# Patient Record
Sex: Male | Born: 1955
Health system: Southern US, Community
[De-identification: ages and names within clinical notes are randomized; demographics above are authoritative.]

## PROBLEM LIST (undated history)

## (undated) DIAGNOSIS — Z87891 Personal history of nicotine dependence: Secondary | ICD-10-CM

## (undated) DIAGNOSIS — F17290 Nicotine dependence, other tobacco product, uncomplicated: Secondary | ICD-10-CM

## (undated) DIAGNOSIS — E785 Hyperlipidemia, unspecified: Secondary | ICD-10-CM

## (undated) DIAGNOSIS — E669 Obesity, unspecified: Secondary | ICD-10-CM

## (undated) HISTORY — DX: Obesity, unspecified: E66.9

## (undated) HISTORY — PX: APPENDECTOMY: SHX54

## (undated) HISTORY — DX: Hyperlipidemia, unspecified: E78.5

## (undated) HISTORY — DX: Personal history of nicotine dependence: Z87.891

## (undated) HISTORY — DX: Nicotine dependence, other tobacco product, uncomplicated: F17.290

---

## 1978-07-22 HISTORY — PX: KNEE ARTHROSCOPY: SHX127

## 1987-07-23 HISTORY — PX: ANTERIOR CRUCIATE LIGAMENT REPAIR: SHX115

## 1998-07-22 HISTORY — PX: ANGIOPLASTY: SHX39

## 2016-07-05 ENCOUNTER — Emergency Department (HOSPITAL_COMMUNITY)
Admission: EM | Admit: 2016-07-05 | Discharge: 2016-07-05 | Disposition: A | Discharge status: Home / Self Care | Payer: BLUE CROSS/BLUE SHIELD | Attending: Emergency Medicine | Admitting: Emergency Medicine

## 2016-07-05 DIAGNOSIS — Y999 Unspecified external cause status: Secondary | ICD-10-CM | POA: Insufficient documentation

## 2016-07-05 DIAGNOSIS — S0501XA Injury of conjunctiva and corneal abrasion without foreign body, right eye, initial encounter: Secondary | ICD-10-CM | POA: Diagnosis not present

## 2016-07-05 DIAGNOSIS — Y929 Unspecified place or not applicable: Secondary | ICD-10-CM | POA: Insufficient documentation

## 2016-07-05 DIAGNOSIS — Y939 Activity, unspecified: Secondary | ICD-10-CM | POA: Diagnosis not present

## 2016-07-05 DIAGNOSIS — X58XXXA Exposure to other specified factors, initial encounter: Secondary | ICD-10-CM | POA: Insufficient documentation

## 2016-07-05 DIAGNOSIS — S0591XA Unspecified injury of right eye and orbit, initial encounter: Secondary | ICD-10-CM | POA: Diagnosis present

## 2016-07-05 MED ORDER — FLUORESCEIN SODIUM 0.6 MG OP STRP
1.0000 | ORAL_STRIP | Freq: Once | OPHTHALMIC | Status: DC
  Filled 2016-07-05: qty 1

## 2016-07-05 MED ORDER — ERYTHROMYCIN 5 MG/GM OP OINT: TOPICAL_OINTMENT | OPHTHALMIC | 0 refills | Status: DC

## 2016-07-05 MED ORDER — TETRACAINE HCL 0.5 % OP SOLN
2.0000 [drp] | Freq: Once | OPHTHALMIC | Status: DC
  Filled 2016-07-05: qty 4

## 2016-07-05 NOTE — ED Provider Notes (Signed)
[facility]-EMERGENCY DEPT Provider Note   CSN: [id number] Arrival date & time: 07/05/16  1540   By signing my name below, I, Soijett Blue, attest that this documentation has been prepared under the direction and in the presence of Fayrene HelperBowie Armanda Forand, PA-C Electronically Signed: Soijett Blue, ED Scribe. 07/05/16. 4:18 PM.  History   Chief Complaint Chief Complaint  Patient presents with  . Eye Injury    HPI Bobby Hale is a 60 y.o. male who presents to the Emergency Department complaining of right eye pain onset 10 AM this morning. Pt states that he was renovating a house and believes that he got debris in his eye while working earlier this morning. Pt denies wearing contact lenses. Pt states that his last tetanus vaccination was within the past 5 years. He states that he is having associated symptoms of blurred vision to right eye, FB sensation to right eye, and watery right eye. He states that he has tried eye irrigation without medications for the relief of his symptoms. He denies fever, chills, and any other symptoms.  The history is provided by the patient. No language interpreter was used.    No past medical history on file.  There are no active problems to display for this patient.   No past surgical history on file.     Home Medications    Prior to Admission medications   Not on File    Family History No family history on file.  Social History Social History  Substance Use Topics  . Smoking status: Not on file  . Smokeless tobacco: Not on file  . Alcohol use Not on file     Allergies   Penicillins   Review of Systems Review of Systems  Constitutional: Negative for chills and fever.  Eyes: Positive for pain (right) and visual disturbance (blurred vision to right eye).       +Watery right eye.  +FB sensation to right eye.    Physical Exam Updated Vital Signs BP 136/79 (BP Location: Right Arm)   Pulse 62   Temp 98.2 F (36.8 C) (Oral)   Resp 18   Ht 6'  5" (1.956 m)   Wt 196 lb 11.2 oz (89.2 kg)   SpO2 96%   BMI 23.33 kg/m   Physical Exam  Constitutional: He is oriented to person, place, and time. He appears well-developed and well-nourished. No distress.  HENT:  Head: Normocephalic and atraumatic.  Eyes: EOM are normal. Pupils are equal, round, and reactive to light. Lids are everted and swept, no foreign bodies found. Right conjunctiva is injected.  Slit lamp exam:      The right eye shows corneal abrasion and fluorescein uptake (2 mm). The right eye shows no corneal ulcer and no foreign body.  Right conjunctiva injected, but limbic sparring. PERRL. EOM intact. Small abrasion to lateral aspect of right cornea. No dendritic lesion. No ulceration. Negative seidel sign.   Neck: Neck supple.  Cardiovascular: Normal rate.   Pulmonary/Chest: Effort normal. No respiratory distress.  Abdominal: He exhibits no distension.  Musculoskeletal: Normal range of motion.  Neurological: He is alert and oriented to person, place, and time.  Skin: Skin is warm and dry.  Psychiatric: He has a normal mood and affect. His behavior is normal.  Nursing note and vitals reviewed.    ED Treatments / Results  DIAGNOSTIC STUDIES: Oxygen Saturation is 96% on RA, nl by my interpretation.    COORDINATION OF CARE: 4:15 PM Discussed treatment plan with pt  at bedside which includes visual acuity, erythromycin ointment, referral and follow up with opthalmology, and pt agreed to plan.   Procedures Procedures (including critical care time)  Medications Ordered in ED Medications  tetracaine (PONTOCAINE) 0.5 % ophthalmic solution 2 drop (not administered)  fluorescein ophthalmic strip 1 strip (not administered)     Initial Impression / Assessment and Plan / ED Course  I have reviewed the triage vital signs and the nursing notes.   Clinical Course    BP 136/79 (BP Location: Right Arm)   Pulse 62   Temp 98.2 F (36.8 C) (Oral)   Resp 18   Ht 6\' 5"   (1.956 m)   Wt 89.2 kg   SpO2 96%   BMI 23.33 kg/m   Pt with corneal abrasion on exam. No evidence of FB.  Exam not concerning for orbital cellulitis, hyphema. No concern for uveitis. Patient will be discharged home with erythromycin ointment Rx.  Patient understands to follow up with ophthalmology; return precautions discussed. Patient appears safe for discharged.   Final Clinical Impressions(s) / ED Diagnoses   Final diagnoses:  Abrasion of right cornea, initial encounter    New Prescriptions New Prescriptions   ERYTHROMYCIN OPHTHALMIC OINTMENT    Place a 1/2 inch ribbon of ointment into the lower eyelid every 6 hours while awake for 5 days.    I personally performed the services described in this documentation, which was scribed in my presence. The recorded information has been reviewed and is accurate.       Fayrene HelperBowie Anjali Manzella, PA-C 07/05/16 1629    Gwyneth SproutWhitney Plunkett, MD 07/05/16 2300

## 2016-07-05 NOTE — ED Triage Notes (Signed)
Pt states he was working on renovating a house and got some debris in his R eye this morning. States he had tearing and pain from eye and now it still feels like something is in his eye. He thinks his last tetanus shot was within 5 yrs. Rates pain 8/10.

## 2016-07-17 ENCOUNTER — Encounter (HOSPITAL_COMMUNITY): Payer: Self-pay | Admitting: Emergency Medicine

## 2016-07-17 ENCOUNTER — Emergency Department (HOSPITAL_COMMUNITY)
Admission: EM | Admit: 2016-07-17 | Discharge: 2016-07-17 | Disposition: A | Discharge status: Home / Self Care | Payer: BLUE CROSS/BLUE SHIELD | Attending: Emergency Medicine | Admitting: Emergency Medicine

## 2016-07-17 ENCOUNTER — Emergency Department (HOSPITAL_COMMUNITY): Payer: BLUE CROSS/BLUE SHIELD

## 2016-07-17 DIAGNOSIS — Y939 Activity, unspecified: Secondary | ICD-10-CM | POA: Insufficient documentation

## 2016-07-17 DIAGNOSIS — S61313A Laceration without foreign body of left middle finger with damage to nail, initial encounter: Secondary | ICD-10-CM | POA: Insufficient documentation

## 2016-07-17 DIAGNOSIS — F17200 Nicotine dependence, unspecified, uncomplicated: Secondary | ICD-10-CM | POA: Insufficient documentation

## 2016-07-17 DIAGNOSIS — Y999 Unspecified external cause status: Secondary | ICD-10-CM | POA: Diagnosis not present

## 2016-07-17 DIAGNOSIS — W312XXA Contact with powered woodworking and forming machines, initial encounter: Secondary | ICD-10-CM | POA: Insufficient documentation

## 2016-07-17 DIAGNOSIS — Y9289 Other specified places as the place of occurrence of the external cause: Secondary | ICD-10-CM | POA: Insufficient documentation

## 2016-07-17 DIAGNOSIS — Z23 Encounter for immunization: Secondary | ICD-10-CM | POA: Diagnosis not present

## 2016-07-17 DIAGNOSIS — Z79899 Other long term (current) drug therapy: Secondary | ICD-10-CM | POA: Diagnosis not present

## 2016-07-17 DIAGNOSIS — S61209A Unspecified open wound of unspecified finger without damage to nail, initial encounter: Secondary | ICD-10-CM

## 2016-07-17 DIAGNOSIS — S61213A Laceration without foreign body of left middle finger without damage to nail, initial encounter: Secondary | ICD-10-CM | POA: Diagnosis not present

## 2016-07-17 MED ORDER — CEPHALEXIN 500 MG PO CAPS: 500.0000 mg | ORAL_CAPSULE | Freq: Four times a day (QID) | ORAL | 0 refills | Status: DC

## 2016-07-17 MED ORDER — BUPIVACAINE HCL (PF) 0.5 % IJ SOLN
10.0000 mL | Freq: Once | INTRAMUSCULAR | Status: AC
  Administered 2016-07-17: 6 mL
  Filled 2016-07-17: qty 10

## 2016-07-17 MED ORDER — OXYCODONE-ACETAMINOPHEN 5-325 MG PO TABS: 1.0000 | ORAL_TABLET | ORAL | 0 refills | Status: DC | PRN

## 2016-07-17 MED ORDER — TETANUS-DIPHTH-ACELL PERTUSSIS 5-2.5-18.5 LF-MCG/0.5 IM SUSP
0.5000 mL | Freq: Once | INTRAMUSCULAR | Status: AC
  Administered 2016-07-17: 0.5 mL via INTRAMUSCULAR
  Filled 2016-07-17: qty 0.5

## 2016-07-17 MED ORDER — ONDANSETRON 8 MG PO TBDP
8.0000 mg | ORAL_TABLET | Freq: Once | ORAL | Status: AC
  Administered 2016-07-17: 8 mg via ORAL
  Filled 2016-07-17: qty 1

## 2016-07-17 NOTE — Discharge Instructions (Signed)
Keep the laceration clean and dry. Apply triple antibiotic ointment twice a day. Follow with Dr. Orlan Leavensrtman as referred to week.

## 2016-07-17 NOTE — ED Triage Notes (Signed)
Patient reports "knicking" left middle finger with circular table saw today. Laceration over finger nail. Bleeding controlled.

## 2016-07-17 NOTE — ED Provider Notes (Signed)
[facility]-EMER[facility] DEPT Provider Note   CSN: [id number] Arrival date & time: 07/17/16  1342    By signing my name below, I, Cynda AcresHailei Fulton, attest that this documentation has been prepared under the direction and in the presence of Jaynie Crumbleatyana Adair Lauderback, PA-C Electronically Signed: Cynda AcresHailei Fulton, Scribe. 07/17/16. 2:00 PM.  History   Chief Complaint Chief Complaint  Patient presents with  . Laceration    HPI Comments: Bobby Hale is a 60 y.o. male who presents to the Emergency Department complaining of sudden-onset, constant middle finger pain s/p laceration that happened earlier today. Patient states he was cutting wood with a table saw and he "knicked" his finger. He has associated light headedness and diaphoresis. Patient states he feels like he's going to faint.  He is unaware when his last tetanus shot was. No modifying factor indicated. He denies profuse bleeding during injury. Pressure was applied with paper towel prior to arrival.   The history is provided by the patient. No language interpreter was used.    History reviewed. No pertinent past medical history.  There are no active problems to display for this patient.   History reviewed. No pertinent surgical history.     Home Medications    Prior to Admission medications   Medication Sig Start Date End Date Taking? Authorizing Provider  erythromycin ophthalmic ointment Place a 1/2 inch ribbon of ointment into the lower eyelid every 6 hours while awake for 5 days. 07/05/16   Fayrene HelperBowie Tran, PA-C    Family History History reviewed. No pertinent family history.  Social History Social History  Substance Use Topics  . Smoking status: Light Tobacco Smoker  . Smokeless tobacco: Never Used  . Alcohol use Not on file     Allergies   Penicillins   Review of Systems Review of Systems  Constitutional: Positive for diaphoresis.  Musculoskeletal: Positive for arthralgias.  Skin: Positive for wound.  Neurological:  Positive for light-headedness.     Physical Exam Updated Vital Signs BP 90/58 (BP Location: Left Arm)   Pulse 79   Temp 98.8 F (37.1 C) (Oral)   Resp 20   SpO2 98%   Physical Exam  Constitutional: He appears well-developed and well-nourished. No distress.  Eyes: Conjunctivae are normal.  Neck: Neck supple.  Cardiovascular: Normal rate, regular rhythm and normal heart sounds.   Pulmonary/Chest: Effort normal and breath sounds normal. No respiratory distress. He has no wheezes. He has no rales.  Abdominal: He exhibits no distension.  Musculoskeletal:  Laceration to the dorsal distal left middle finger partially involving the fingernail and underlying nail bed. No exposed bone. Refill less than 2 seconds to the fingertip. Sensation is intact.  Skin: Skin is warm and dry.  Nursing note and vitals reviewed.      ED Treatments / Results  DIAGNOSTIC STUDIES: Oxygen Saturation is 98% on RA, normal by my interpretation.    COORDINATION OF CARE: 2:09 PM Discussed treatment plan with pt at bedside and pt agreed to plan.  Labs (all labs ordered are listed, but only abnormal results are displayed) Labs Reviewed - No data to display  EKG  EKG Interpretation None       Radiology No results found.  Procedures Procedures (including critical care time)  NERVE BLOCK Performed by: Jaynie CrumbleKIRICHENKO, [place] A Consent: Verbal consent obtained. Required items: required blood products, implants, devices, and special equipment available Time out: Immediately prior to procedure a "time out" was called to verify the correct patient, procedure, equipment, support staff and site/side  marked as required.  Indication: pain control Nerve block body site: right middle finger  Preparation: Patient was prepped and draped in the usual sterile fashion. Needle gauge: 27G Location technique: anatomical landmarks  Local anesthetic: bupivacaine 0.5% WO epi    Anesthetic total: 5 ml  Outcome:  pain improved Patient tolerance: Patient tolerated the procedure well with no immediate complications.  Medications Ordered in ED Medications - No data to display   Initial Impression / Assessment and Plan / ED Course  I have reviewed the triage vital signs and the nursing notes.  Pertinent labs & imaging results that were available during my care of the patient were reviewed by me and considered in my medical decision making (see chart for details).  Clinical Course    Patient with laceration to the distal left middle finger, with some tissue missing. Digital block performed for pain control. Patient is diaphoretic and lightheaded, most likely vagal from pain and bleeding. Patient's wife states that he does get sometimes like this when he sees bodily fluids. Patient was laid down flat. He feels better. Laceration was extensively cleaned with surgical scrub sponge, saline, nonstick dressing and sterile gauze was applied. I discussed patient with Dr. Orlan Leavensrtman, who agreed with treatment plan and will follow-up in the office next week. Will start on Keflex to prevent infection. Pain medication will be prescribed. Instructed to keep finger clean, dressing changes twice a day, follow-up next week or as needed.   Vitals:   07/17/16 1348 07/17/16 1545  BP: 90/58 132/76  Pulse: 79 62  Resp: 20 18  Temp: 98.8 F (37.1 C)   TempSrc: Oral   SpO2: 98% 97%     Final Clinical Impressions(s) / ED Diagnoses   Final diagnoses:  Fingertip avulsion, initial encounter    New Prescriptions Discharge Medication List as of 07/17/2016  3:38 PM    START taking these medications   Details  cephALEXin (KEFLEX) 500 MG capsule Take 1 capsule (500 mg total) by mouth 4 (four) times daily., Starting Wed 07/17/2016, Print    oxyCODONE-acetaminophen (PERCOCET) 5-325 MG tablet Take 1 tablet by mouth every 4 (four) hours as needed for severe pain., Starting Wed 07/17/2016, Print       I personally performed  the services described in this documentation, which was scribed in my presence. The recorded information has been reviewed and is accurate.    Jaynie Crumbleatyana Jaydah Stahle, PA-C 07/17/16 1630    Cathren LaineKevin Steinl, MD 07/17/16 2041

## 2016-08-24 ENCOUNTER — Emergency Department (HOSPITAL_COMMUNITY): Payer: BLUE CROSS/BLUE SHIELD

## 2016-08-24 ENCOUNTER — Encounter (HOSPITAL_COMMUNITY): Payer: Self-pay | Admitting: Emergency Medicine

## 2016-08-24 ENCOUNTER — Emergency Department (HOSPITAL_COMMUNITY)
Admission: EM | Admit: 2016-08-24 | Discharge: 2016-08-24 | Disposition: A | Discharge status: Home / Self Care | Payer: BLUE CROSS/BLUE SHIELD | Attending: Emergency Medicine | Admitting: Emergency Medicine

## 2016-08-24 DIAGNOSIS — Y929 Unspecified place or not applicable: Secondary | ICD-10-CM | POA: Diagnosis not present

## 2016-08-24 DIAGNOSIS — F17200 Nicotine dependence, unspecified, uncomplicated: Secondary | ICD-10-CM | POA: Diagnosis not present

## 2016-08-24 DIAGNOSIS — Y939 Activity, unspecified: Secondary | ICD-10-CM | POA: Insufficient documentation

## 2016-08-24 DIAGNOSIS — S61301A Unspecified open wound of left index finger with damage to nail, initial encounter: Secondary | ICD-10-CM | POA: Diagnosis not present

## 2016-08-24 DIAGNOSIS — W231XXA Caught, crushed, jammed, or pinched between stationary objects, initial encounter: Secondary | ICD-10-CM | POA: Diagnosis not present

## 2016-08-24 DIAGNOSIS — Z79899 Other long term (current) drug therapy: Secondary | ICD-10-CM | POA: Insufficient documentation

## 2016-08-24 DIAGNOSIS — Y999 Unspecified external cause status: Secondary | ICD-10-CM | POA: Insufficient documentation

## 2016-08-24 DIAGNOSIS — M79645 Pain in left finger(s): Secondary | ICD-10-CM | POA: Diagnosis not present

## 2016-08-24 DIAGNOSIS — S61209A Unspecified open wound of unspecified finger without damage to nail, initial encounter: Secondary | ICD-10-CM

## 2016-08-24 DIAGNOSIS — S6992XA Unspecified injury of left wrist, hand and finger(s), initial encounter: Secondary | ICD-10-CM | POA: Diagnosis not present

## 2016-08-24 MED ORDER — LIDOCAINE HCL 1 % IJ SOLN
INTRAMUSCULAR | Status: AC
  Filled 2016-08-24: qty 20

## 2016-08-24 MED ORDER — SULFAMETHOXAZOLE-TRIMETHOPRIM 800-160 MG PO TABS: 1.0000 | ORAL_TABLET | Freq: Two times a day (BID) | ORAL | 0 refills | Status: AC

## 2016-08-24 MED ORDER — LIDOCAINE HCL (PF) 1 % IJ SOLN: 5.0000 mL | Freq: Once | INTRAMUSCULAR | Status: DC

## 2016-08-24 MED ORDER — SULFAMETHOXAZOLE-TRIMETHOPRIM 800-160 MG PO TABS
1.0000 | ORAL_TABLET | Freq: Once | ORAL | Status: AC
  Administered 2016-08-24: 1 via ORAL
  Filled 2016-08-24: qty 1

## 2016-08-24 NOTE — ED Notes (Signed)
Pt soaking finger in water and betadine.

## 2016-08-24 NOTE — Discharge Instructions (Signed)
As discussed, it is important that you monitor your condition carefully, and if you develop new, or concerning changes in your condition return here for further evaluation. Otherwise, please be sure to follow-up with our hand surgeon.  In the coming days, please keep the wound clean, dry, wrapped.

## 2016-08-24 NOTE — ED Triage Notes (Signed)
Pt presents with left index finger injury related to dresser landing on it. Fat tissue noted. Pt able to move finger appropriately.

## 2016-08-24 NOTE — ED Provider Notes (Signed)
[facility]ER[facility] DEPT Provider Note   CSN: [id number] Arrival date & time: 08/24/16  1529  By signing my name below, I, Doreatha Martin, attest that this documentation has been prepared under the direction and in the presence of Gerhard Munch, MD. Electronically Signed: Doreatha Martin, ED Scribe. 08/24/16. 3:53 PM.     History   Chief Complaint Chief Complaint  Patient presents with  . Finger Injury    HPI Ivis Henneman is a 61 y.o. male who presents to the Emergency Department complaining of a laceration with controlled bleeding to the left index finger that occurred just PTA. 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 He denies falls, LOC, head injury. Pt reports associated pain to the area surrounding the wound. He states he took 2 Percocet PTA with some relief of pain. He denies numbness, additional injuries.   The history is provided by the patient. No language interpreter was used.    History reviewed. No pertinent past medical history.  There are no active problems to display for this patient.   History reviewed. No pertinent surgical history.     Home Medications    Prior to Admission medications   Medication Sig Start Date End Date Taking? Authorizing Provider  cephALEXin (KEFLEX) 500 MG capsule Take 1 capsule (500 mg total) by mouth 4 (four) times daily. 07/17/16   Tatyana Kirichenko, PA-C  erythromycin ophthalmic ointment Place a 1/2 inch ribbon of ointment into the lower eyelid every 6 hours while awake for 5 days. 07/05/16   Fayrene Helper, PA-C  oxyCODONE-acetaminophen (PERCOCET) 5-325 MG tablet Take 1 tablet by mouth every 4 (four) hours as needed for severe pain. 07/17/16   Jaynie Crumble, PA-C    Family History No family history on file.  Social History Social History  Substance Use Topics  . Smoking status: Light Tobacco Smoker  . Smokeless tobacco: Never Used  . Alcohol use Not on file     Allergies     Penicillins   Review of Systems Review of Systems  Constitutional: Negative for fever.  Respiratory: Negative for shortness of breath.   Cardiovascular: Negative for chest pain.  Musculoskeletal:       Negative aside from HPI  Skin:       Negative aside from HPI  Allergic/Immunologic: Negative for immunocompromised state.  Neurological: Negative for weakness.     Physical Exam Updated Vital Signs BP 116/58 (BP Location: Left Arm)   Pulse 68   Temp 97.8 F (36.6 C) (Oral)   Resp 18   Ht 6\' 5"  (1.956 m)   Wt 250 lb (113.4 kg)   SpO2 99%   BMI 29.65 kg/m   Physical Exam  Constitutional: He is oriented to person, place, and time. He appears well-developed. No distress.  HENT:  Head: Normocephalic and atraumatic.  Eyes: Conjunctivae and EOM are normal.  Cardiovascular: Normal rate, regular rhythm and intact distal pulses.   Pulmonary/Chest: Effort normal. No stridor. No respiratory distress.  Abdominal: He exhibits no distension.  Musculoskeletal: He exhibits no edema.       Arms: Neurological: He is alert and oriented to person, place, and time.  Skin: Skin is warm and dry.  Psychiatric: He has a normal mood and affect.  Nursing note and vitals reviewed.    ED Treatments / Results   DIAGNOSTIC STUDIES: Oxygen Saturation is 99% on RA, normal by my interpretation.    COORDINATION OF CARE: 3:50 PM Discussed treatment  plan with pt at bedside which includes XR, wound care and pt agreed to plan.   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 FINDINGS: There is no evidence of fracture or dislocation. There is no evidence of arthropathy or other focal bone abnormality. Soft tissues are  unremarkable. IMPRESSION: Negative. Electronically Signed   By: Bary RichardStan  Maynard M.D.   On: 08/24/2016 16:35    Procedures Procedures (including critical care time)  NERVE BLOCK Performed by: Gerhard Munchobert Jaiveer Panas, MD  Consent: Verbal consent obtained. Required items: required blood products, implants, devices, and special equipment available Time out: Immediately prior to procedure a "time out" was called to verify the correct patient, procedure, equipment, support staff and site/side marked as required.  Indication: fingertip avulsion  Nerve block body site: left 2nd digit   Preparation: Patient was prepped and draped in the usual sterile fashion. Needle gauge: 27 G Location technique: anatomical landmarks  Local anesthetic: 1 % lidocaine without epi  Anesthetic total: 8 ml  Outcome: pain improved Patient tolerance: Patient tolerated the procedure well with no immediate complications.     Medications Ordered in ED Medications  lidocaine (XYLOCAINE) 1 % (with pres) injection (not administered)  lidocaine (PF) (XYLOCAINE) 1 % injection 5 mL (not administered)  sulfamethoxazole-trimethoprim (BACTRIM DS,SEPTRA DS) 800-160 MG per tablet 1 tablet (1 tablet Oral Given 08/24/16 1747)     Initial Impression / Assessment and Plan / ED Course  I have reviewed the triage vital signs and the nursing notes.  Pertinent labs & imaging results that were available during my care of the patient were reviewed by me and considered in my medical decision making (see chart for details).  I personally performed the services described in this documentation, which was scribed in my presence. The recorded information has been reviewed and is accurate.    Patient presents after sustaining an avulsion injury to the left distal index finger. Patient is neurovascular intact, though there is a notable deformity. No tissue remaining for suture repair, but the patient is minimal active bleeding, no distress,  evidence for other injuries. Patient had analgesia provided via digital block, and took home Percocet prior to my intervention. Patient started on antibiotics, after irrigation, the patient was discharged in stable condition to follow-up with hand surgery.    Gerhard Munchobert Avri Paiva, MD 08/24/16 1750

## 2016-09-02 ENCOUNTER — Encounter: Payer: Self-pay | Admitting: Family Medicine

## 2016-09-02 ENCOUNTER — Ambulatory Visit (INDEPENDENT_AMBULATORY_CARE_PROVIDER_SITE_OTHER): Payer: BLUE CROSS/BLUE SHIELD | Admitting: Family Medicine

## 2016-09-02 VITALS — BP 118/80 | HR 66 | Temp 97.7°F | Ht 75.0 in | Wt 251.8 lb

## 2016-09-02 DIAGNOSIS — Z87891 Personal history of nicotine dependence: Secondary | ICD-10-CM

## 2016-09-02 DIAGNOSIS — E6609 Other obesity due to excess calories: Secondary | ICD-10-CM | POA: Diagnosis not present

## 2016-09-02 DIAGNOSIS — F17290 Nicotine dependence, other tobacco product, uncomplicated: Secondary | ICD-10-CM | POA: Diagnosis not present

## 2016-09-02 DIAGNOSIS — H6123 Impacted cerumen, bilateral: Secondary | ICD-10-CM | POA: Diagnosis not present

## 2016-09-02 DIAGNOSIS — N401 Enlarged prostate with lower urinary tract symptoms: Secondary | ICD-10-CM

## 2016-09-02 DIAGNOSIS — Z0000 Encounter for general adult medical examination without abnormal findings: Secondary | ICD-10-CM | POA: Diagnosis not present

## 2016-09-02 DIAGNOSIS — R0789 Other chest pain: Secondary | ICD-10-CM | POA: Diagnosis not present

## 2016-09-02 DIAGNOSIS — E66811 Obesity, class 1: Secondary | ICD-10-CM

## 2016-09-02 DIAGNOSIS — Z6831 Body mass index (BMI) 31.0-31.9, adult: Secondary | ICD-10-CM | POA: Diagnosis not present

## 2016-09-02 DIAGNOSIS — E785 Hyperlipidemia, unspecified: Secondary | ICD-10-CM | POA: Diagnosis not present

## 2016-09-02 DIAGNOSIS — R351 Nocturia: Secondary | ICD-10-CM | POA: Diagnosis not present

## 2016-09-02 LAB — LIPID PANEL
Cholesterol: 251 mg/dL — ABNORMAL HIGH (ref 0–200)
HDL: 49.8 mg/dL (ref >39.00)
LDL Cholesterol: 161 mg/dL — ABNORMAL HIGH (ref 0–99)
NonHDL: 200.98
Total CHOL/HDL Ratio: 5
Triglycerides: 198 mg/dL — ABNORMAL HIGH (ref 0.0–149.0)
VLDL: 39.6 mg/dL (ref 0.0–40.0)

## 2016-09-02 LAB — COMPREHENSIVE METABOLIC PANEL
ALT: 32 U/L (ref 0–53)
AST: 18 U/L (ref 0–37)
Albumin: 4.4 g/dL (ref 3.5–5.2)
Alkaline Phosphatase: 76 U/L (ref 39–117)
BUN: 14 mg/dL (ref 6–23)
CO2: 28 mEq/L (ref 19–32)
Calcium: 9.4 mg/dL (ref 8.4–10.5)
Chloride: 105 mEq/L (ref 96–112)
Creatinine, Ser: 0.83 mg/dL (ref 0.40–1.50)
GFR: 100.31 mL/min (ref >60.00)
Glucose, Bld: 91 mg/dL (ref 70–99)
Potassium: 4.5 mEq/L (ref 3.5–5.1)
Sodium: 137 mEq/L (ref 135–145)
Total Bilirubin: 0.5 mg/dL (ref 0.2–1.2)
Total Protein: 6.9 g/dL (ref 6.0–8.3)

## 2016-09-02 LAB — PSA: PSA: 4.87 ng/mL — ABNORMAL HIGH (ref 0.10–4.00)

## 2016-09-02 NOTE — Progress Notes (Signed)
Pre visit review using our clinic review tool, if applicable. No additional management support is needed unless otherwise documented below in the visit note. 

## 2016-09-02 NOTE — Progress Notes (Signed)
Subjective:    Bobby Hale is a 61 y.o. male and is here for a comprehensive physical exam.  1. Atypical chest pain: Thinks it was indigestion. Worried because he has a friend that recently had an MI while exercising at the gym. Previous smoker. Exercises by walking and playing golf. No SOB, Ha, dizziness, N/V, edema. Nonexertional. None now.     Prostate Symptoms Questionnaire: 1. Have you had the sensation of not emptying your bladder completely after you finished urinating? Yes.   2. Have you had to urinate again less than two hours after you finished urinating? Yes.   3. Have you found you stopped and started again several times when you urinated? No. 4. Have you found it difficult to postpone urination? No. 5. Have you had a weak urinary stream? Yes.   6. Have you had to push or strain to begin urination? Yes.   7. How many times did you most typically get up to urinate from the time you went to bed at night until the time you got up in the morning? 2-3  Past Medical History:  Diagnosis Date  . Cigar smoker   . Former heavy cigarette smoker (20-39 per day)    Quit 1996  . HLD (hyperlipidemia)   . Obesity    Past Surgical History:  Procedure Laterality Date  . ANGIOPLASTY  2000  . ANTERIOR CRUCIATE LIGAMENT REPAIR Right 1989  . APPENDECTOMY    . KNEE ARTHROSCOPY Right 1980   total of 9 surgeries    Social History  . Marital status: Married    Spouse name: N/A  . Number of children: 3  . Years of education: N/A   Occupational History  . Sales    Social History Main Topics  . Smoking status: Light Tobacco Smoker    Types: Cigars  . Smokeless tobacco: Never Used  . Alcohol use Yes  . Drug use: No  . Sexual activity: Yes   Family History  Problem Relation Age of Onset  . Cancer Mother   . Heart disease Father   . Cancer Brother    Allergies  Allergen Reactions  . Penicillins    Outpatient Medications Prior to Visit  None  Review of  Systems: Patient reports no vision/hearing changes, anorexia, fever, adenopathy, persistant/recurrent hoarseness, swallowing issues, palpitations, edema, persistant/recurrent cough, hemoptysis, dyspnea (rest, exertional, paroxysmal nocturnal), gastrointestinal bleeding (melena, rectal bleeding), abdominal pain, excessive heartburn, GU symptoms (dysuria, hematuria, incontinence issues), syncope, focal weakness, memory loss, numbness & tingling, skin/hair/nail changes, depression, anxiety, abnormal bruising/bleeding, musculoskeletal symptoms/signs.   Objective:   Vitals:   09/02/16 0908  BP: 118/80  Pulse: 66  Temp: 97.7 F (36.5 C)   Body mass index is 31.47 kg/m.  General  Alert, cooperative, no distress, appears stated age  Head:  Normocephalic, without obvious abnormality, atraumatic  Eyes:  PERRL, conjunctiva/corneas clear, EOM's intact, fundi benign, both eyes       Ears:  Normal TM's and external ear canals, both ears  Nose: Nares normal, septum midline, mucosa normal, no drainage or sinus tenderness  Throat: Lips, mucosa, and tongue normal; teeth and gums normal  Neck: Supple, symmetrical, trachea midline, no adenopathy;     thyroid:  No enlargement/tenderness/nodules; no carotid bruit or JVD  Back:   Symmetric, no curvature, ROM normal, no CVA tenderness  Lungs:   Clear to auscultation bilaterally, respirations unlabored  Chest wall:  No tenderness or deformity  Heart:  Regular rate and rhythm, S1 and  S2 normal, no murmur, rub or gallop  Abdomen:   Soft, non-tender, bowel sounds active all four quadrants, no masses, no organomegaly  Extremities: Extremities normal, atraumatic, no cyanosis or edema  Prostate : Not done  Skin: Skin color, texture, turgor normal, no rashes or lesions  Lymph nodes: Cervical, supraclavicular, and axillary nodes normal  Neurologic: CNII-XII grossly intact. Normal strength, sensation and reflexes throughout    EKG: normal EKG, normal sinus  rhythm.   AssessmentPlan:    Nikoloz was seen today for new patient (initial visit).  Diagnoses and all orders for this visit:  Routine physical examination  Bilateral impacted cerumen -     Ear Lavage  Procedure: Cerumen Disimpaction Peroxide drops applied and gentle ear lavage performed bilaterally. There were no complications and following the disimpaction the tympanic membrane was visible bilaterally. Tympanic membranes are intact following the procedure.  Auditory canals are normal.  The patient reported relief of symptoms after removal of cerumen.   Atypical chest pain -     EKG 12-Lead   Benign prostatic hyperplasia with nocturia -     PSA  Hyperlipidemia, unspecified hyperlipidemia type -     Comprehensive metabolic panel; Future -     Lipid panel; Future  Class 1 obesity due to excess calories without serious comorbidity with body mass index (BMI) of 31.0 to 31.9 in adult  Former smoker, stopped smoking many years ago  Cigar smoker unmotivated to quit   Well Adult Exam: Labs ordered: Yes. Patient counseling was done. See below for items discussed. Discussed the patient's BMI.  The BMI BMI is not in the acceptable range; BMI management plan is completed Follow up in 3 months. Colon Cancer: up to date per patient.    Patient Counseling: [x]   Nutrition: Stressed importance of moderation in sodium/caffeine intake, saturated fat and cholesterol, caloric balance, sufficient intake of fresh fruits, vegetables, and fiber  [x]   Stressed the importance of regular exercise.   [x]   Substance Abuse: Discussed cessation/primary prevention of tobacco, alcohol, or other drug use; driving or other dangerous activities under the influence; availability of treatment for abuse.   [x]   Injury prevention: Discussed safety belts, safety helmets, smoke detector, smoking near bedding or upholstery.   []   Sexuality: Discussed sexually transmitted diseases, partner selection, use of condoms,  avoidance of unintended pregnancy  and contraceptive alternatives.   [x]   Dental health: Discussed importance of regular tooth brushing, flossing, and dental visits.  [x]   Health maintenance and immunizations reviewed. Please refer to Health maintenance section.

## 2016-09-06 DIAGNOSIS — H2513 Age-related nuclear cataract, bilateral: Secondary | ICD-10-CM | POA: Diagnosis not present

## 2016-09-08 MED ORDER — SIMVASTATIN 20 MG PO TABS: 20.0000 mg | ORAL_TABLET | Freq: Every evening | ORAL | 3 refills | Status: DC

## 2016-09-08 NOTE — Addendum Note (Signed)
Addended by: Helane RimaWALLACE, Vetra Shinall R on: 09/08/2016 01:08 AM   Modules accepted: Orders

## 2016-09-08 NOTE — Treatment Plan (Signed)
Lab Results  Component Value Date   CHOL 251 (H) 09/02/2016   HDL 49.80 09/02/2016   LDLCALC 161 (H) 09/02/2016   TRIG 198.0 (H) 09/02/2016   CHOLHDL 5 09/02/2016   Vitals:   09/02/16 0908  BP: 118/80  Pulse: 66  Temp: 97.7 F (36.5 C)   The ASCVD Risk Score: 14.2%  - Moderate to high intensity statin recommended. Will send in and recommend to patient.

## 2016-09-16 ENCOUNTER — Other Ambulatory Visit: Payer: Self-pay

## 2016-09-16 ENCOUNTER — Telehealth: Payer: Self-pay

## 2016-09-16 DIAGNOSIS — E785 Hyperlipidemia, unspecified: Secondary | ICD-10-CM

## 2016-09-16 MED ORDER — SIMVASTATIN 20 MG PO TABS: 20.0000 mg | ORAL_TABLET | Freq: Every evening | ORAL | 3 refills | Status: DC

## 2016-09-16 NOTE — Telephone Encounter (Signed)
Patient calling.  Rx was not received at pharmacy.  Verified pharmacy and re-sent Rx to [facility] [address] for patient pick up.  Patient verbalized understanding.

## 2016-11-25 ENCOUNTER — Ambulatory Visit (INDEPENDENT_AMBULATORY_CARE_PROVIDER_SITE_OTHER): Payer: BLUE CROSS/BLUE SHIELD

## 2016-11-25 ENCOUNTER — Encounter: Payer: Self-pay | Admitting: Family Medicine

## 2016-11-25 ENCOUNTER — Ambulatory Visit (INDEPENDENT_AMBULATORY_CARE_PROVIDER_SITE_OTHER): Payer: BLUE CROSS/BLUE SHIELD | Admitting: Family Medicine

## 2016-11-25 VITALS — BP 122/76 | HR 75 | Temp 98.2°F | Ht 75.0 in | Wt 253.4 lb

## 2016-11-25 DIAGNOSIS — M79672 Pain in left foot: Secondary | ICD-10-CM | POA: Diagnosis not present

## 2016-11-25 DIAGNOSIS — S93602A Unspecified sprain of left foot, initial encounter: Secondary | ICD-10-CM

## 2016-11-25 NOTE — Progress Notes (Signed)
Bobby Hale is a 61 y.o. male here for an acute visit.  History of Present Illness:   Bobby Hale CMA acting as scribe for Dr. Earlene PlaterWallace.  HPI: Patient comes in today for ankle pain and swelling. Pain has been for about three weeks. He stated that the pain started in the bottom of his left foot when he woke up and would get better throughout the day. Now pain is radiating up the ankle and having some swelling. Patient stated that he has tried ice and tylenol, but he is not getting any relief.   PMHx, SurgHx, SocialHx, Medications, and Allergies were reviewed in the Visit Navigator and updated as appropriate.  Current Medications:   .  simvastatin (ZOCOR) 20 MG tablet, Take 1 tablet (20 mg total) by mouth every evening., Disp: 90 tablet, Rfl: 3   Review of Systems:   Review of Systems  Constitutional: Negative for chills, fever and malaise/fatigue.  HENT: Negative for congestion, ear pain, sinus pain and sore throat.   Eyes: Negative for blurred vision and double vision.  Respiratory: Negative for cough, shortness of breath and wheezing.   Cardiovascular: Negative for chest pain, palpitations and leg swelling.  Gastrointestinal: Negative for abdominal pain and vomiting.  Genitourinary: Negative for dysuria.  Musculoskeletal: Negative for back pain, joint pain and neck pain.  Neurological: Negative for dizziness and headaches.  Psychiatric/Behavioral: Negative for depression, hallucinations and memory loss.   Vitals:   Vitals:   11/25/16 1035  BP: 122/76  Pulse: 75  Temp: 98.2 F (36.8 C)  TempSrc: Oral  SpO2: 95%  Weight: 253 lb 6.4 oz (114.9 kg)  Height: 6\' 3"  (1.905 m)     Body mass index is 31.67 kg/m.  Physical Exam:   Physical Exam  Constitutional: He is oriented to person, place, and time. He appears well-developed and well-nourished. No distress.  Eyes: Conjunctivae and EOM are normal. Pupils are equal, round, and reactive to light.  Neck: Normal range  of motion. Neck supple.  Cardiovascular: Normal rate, regular rhythm, normal heart sounds and intact distal pulses.   Pulmonary/Chest: Effort normal and breath sounds normal.  Musculoskeletal:       Feet:  Neurological: He is alert and oriented to person, place, and time.  Skin: Skin is warm and dry.  Psychiatric: He has a normal mood and affect. His behavior is normal. Judgment and thought content normal.  Nursing note and vitals reviewed.   Assessment and Plan:    Bobby Hale was seen today for ankle pain.  Diagnoses and all orders for this visit:  Left foot pain -     DG Foot Complete Left; Future -     Ambulatory referral to Sports Medicine  Foot sprain, left, initial encounter Comments: Negative imaging today. I recommend supportive shoes. Ice and rest. To sports medicine if not improving in 2 weeks.   . Reviewed expectations re: course of current medical issues. . Discussed self-management of symptoms. . Outlined signs and symptoms indicating need for more acute intervention. . Patient verbalized understanding and all questions were answered. . See orders for this visit as documented in the electronic medical record. . Patient received an After-Visit Summary.  CMA served as Neurosurgeonscribe during this visit. History, Physical, and Plan performed by medical provider. Documentation and orders reviewed and attested to. Helane RimaErica Aleister Lady, D.O.  Helane RimaErica Erric Machnik, D.O. [facility] 11/25/2016   Future Appointments Date Time Provider Department Center  12/09/2016 8:30 AM Andrena Mewsigby, Michael D, DO [facility]  None

## 2016-11-25 NOTE — Patient Instructions (Signed)
Can get over the counter orthotics. Wear supportive shoes.

## 2016-12-09 ENCOUNTER — Ambulatory Visit: Payer: BLUE CROSS/BLUE SHIELD | Admitting: Sports Medicine

## 2017-04-30 IMAGING — DX DG FINGER INDEX 2+V*L*
3 series · 3 of 3 positions shown · non-contrast
Comparison: None.

CLINICAL DATA: Smashed left index finger between floor and a
dresser while putting the dresser down; most pain at the distal end
of left index finger; possible previous injury as the patient was Dinges
Saazee Bantumilli, but no hx of fx

EXAM:
LEFT INDEX FINGER 2+V

[finger ap]
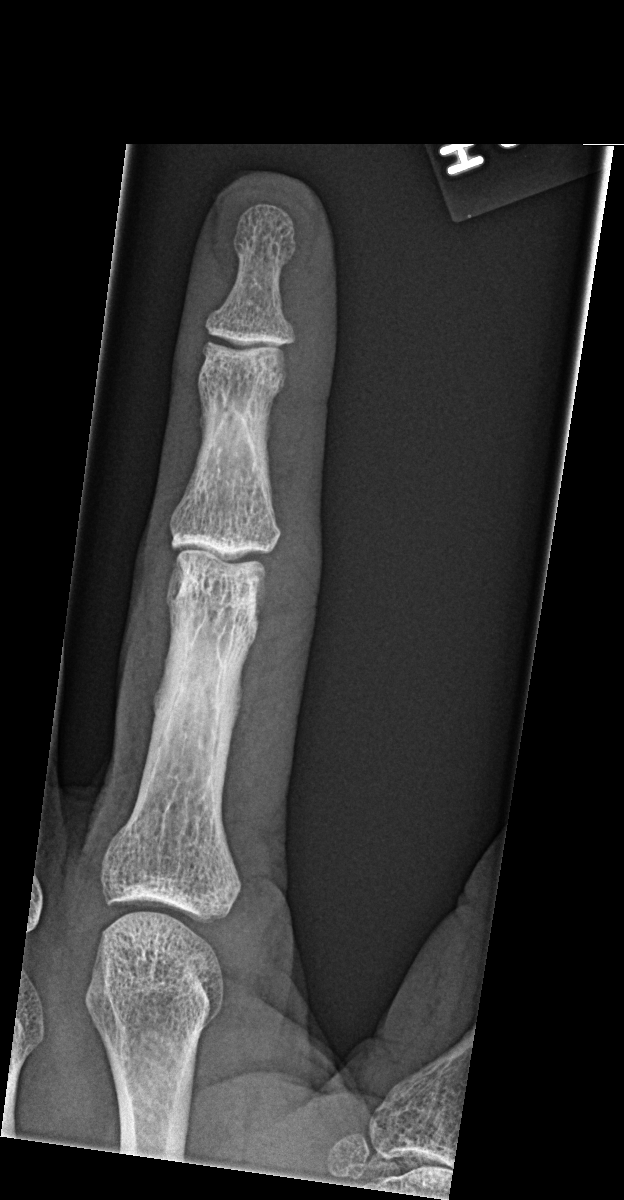

[finger lat]
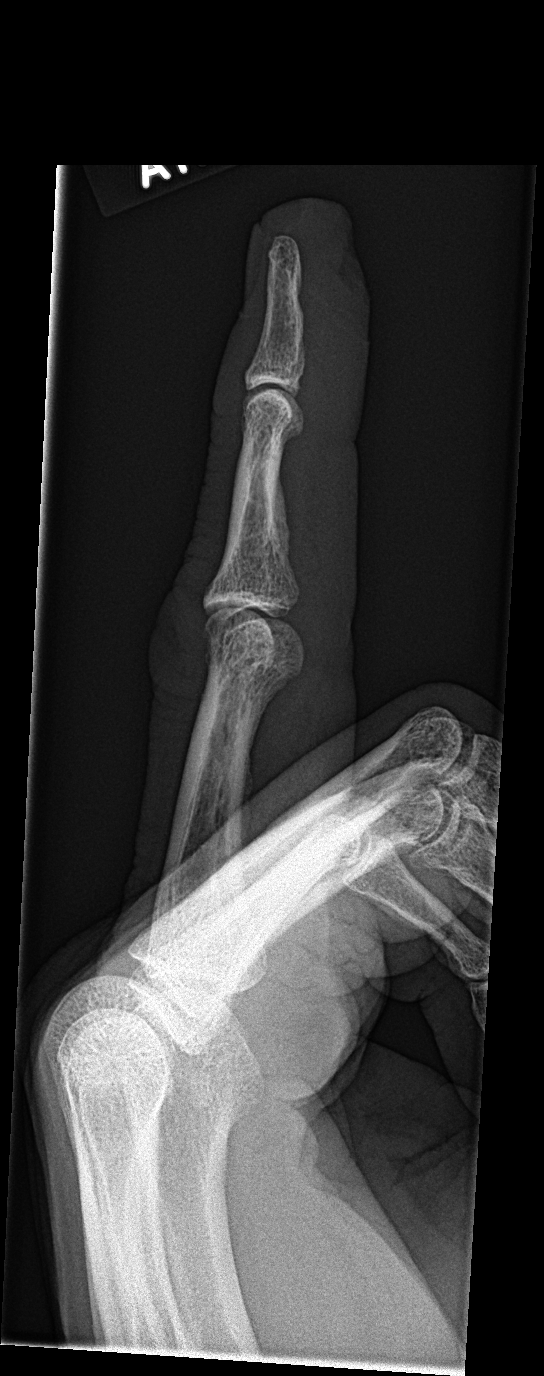

[finger obl]
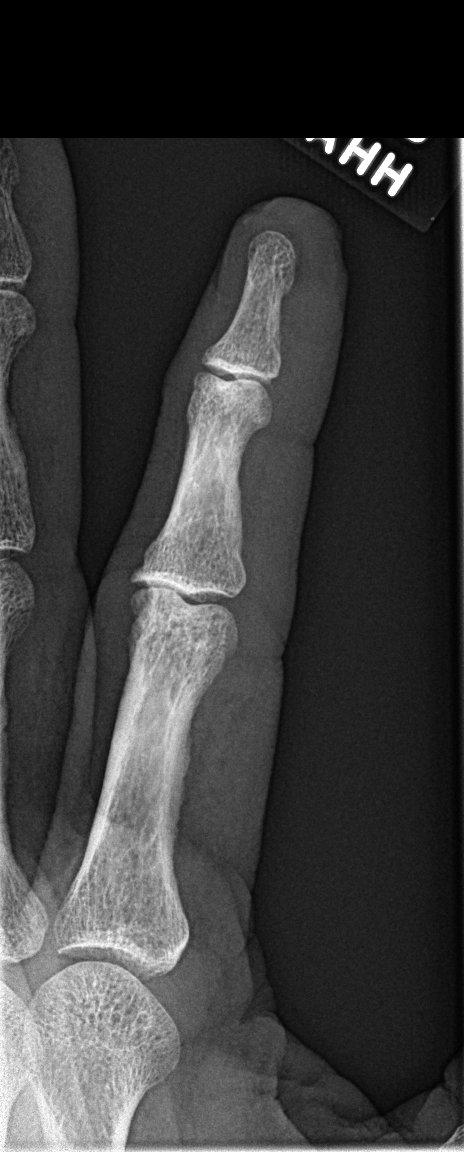

[3 of 3 positions shown; findings below may reference images not displayed]

FINDINGS: There is no evidence of fracture or dislocation. There is no
evidence of arthropathy or other focal bone abnormality. Soft
tissues are unremarkable.
IMPRESSION: Negative.

## 2018-02-03 ENCOUNTER — Encounter: Payer: Self-pay | Admitting: Family Medicine

## 2018-02-03 ENCOUNTER — Ambulatory Visit (INDEPENDENT_AMBULATORY_CARE_PROVIDER_SITE_OTHER): Payer: BLUE CROSS/BLUE SHIELD | Admitting: Family Medicine

## 2018-02-03 VITALS — BP 128/88 | Temp 98.1°F | Ht 75.0 in | Wt 265.2 lb

## 2018-02-03 DIAGNOSIS — Z720 Tobacco use: Secondary | ICD-10-CM

## 2018-02-03 DIAGNOSIS — Z9189 Other specified personal risk factors, not elsewhere classified: Secondary | ICD-10-CM | POA: Diagnosis not present

## 2018-02-03 DIAGNOSIS — R351 Nocturia: Secondary | ICD-10-CM

## 2018-02-03 DIAGNOSIS — N401 Enlarged prostate with lower urinary tract symptoms: Secondary | ICD-10-CM

## 2018-02-03 DIAGNOSIS — Z0000 Encounter for general adult medical examination without abnormal findings: Secondary | ICD-10-CM

## 2018-02-03 DIAGNOSIS — Z1159 Encounter for screening for other viral diseases: Secondary | ICD-10-CM

## 2018-02-03 DIAGNOSIS — E785 Hyperlipidemia, unspecified: Secondary | ICD-10-CM

## 2018-02-03 LAB — COMPREHENSIVE METABOLIC PANEL
ALT: 19 U/L (ref 0–53)
AST: 14 U/L (ref 0–37)
Albumin: 4.4 g/dL (ref 3.5–5.2)
Alkaline Phosphatase: 93 U/L (ref 39–117)
BUN: 16 mg/dL (ref 6–23)
CO2: 30 mEq/L (ref 19–32)
Calcium: 9.3 mg/dL (ref 8.4–10.5)
Chloride: 104 mEq/L (ref 96–112)
Creatinine, Ser: 0.85 mg/dL (ref 0.40–1.50)
GFR: 97.13 mL/min (ref >60.00)
Glucose, Bld: 90 mg/dL (ref 70–99)
Potassium: 4.2 mEq/L (ref 3.5–5.1)
Sodium: 139 mEq/L (ref 135–145)
Total Bilirubin: 0.8 mg/dL (ref 0.2–1.2)
Total Protein: 6.9 g/dL (ref 6.0–8.3)

## 2018-02-03 LAB — LIPID PANEL
Cholesterol: 222 mg/dL — ABNORMAL HIGH (ref 0–200)
HDL: 41.4 mg/dL (ref >39.00)
LDL Cholesterol: 141 mg/dL — ABNORMAL HIGH (ref 0–99)
NonHDL: 180.49
Total CHOL/HDL Ratio: 5
Triglycerides: 197 mg/dL — ABNORMAL HIGH (ref 0.0–149.0)
VLDL: 39.4 mg/dL (ref 0.0–40.0)

## 2018-02-03 LAB — PSA: PSA: 8.74 ng/mL — ABNORMAL HIGH (ref 0.10–4.00)

## 2018-02-03 MED ORDER — SIMVASTATIN 20 MG PO TABS: 20.0000 mg | ORAL_TABLET | Freq: Every evening | ORAL | 3 refills | Status: DC

## 2018-02-03 MED ORDER — ASPIRIN EC 81 MG PO TBEC: 81.0000 mg | DELAYED_RELEASE_TABLET | Freq: Every day | ORAL | 3 refills | Status: DC

## 2018-02-03 MED ORDER — HYDROCHLOROTHIAZIDE 12.5 MG PO CAPS: 12.5000 mg | ORAL_CAPSULE | Freq: Every day | ORAL | 3 refills | Status: DC

## 2018-02-03 NOTE — Progress Notes (Signed)
Subjective:    Donivan ScullFrancis Ratti is a 62 y.o. male who presents today for his Complete Annual Exam.  Current Outpatient Medications:  .  simvastatin (ZOCOR) 20 MG tablet, Take 1 tablet (20 mg total) by mouth every evening., Disp: 90 tablet, Rfl: 3  There are no preventive care reminders to display for this patient.   PMHx, SurgHx, SocialHx, Medications, and Allergies were reviewed in the Visit Navigator and updated as appropriate.   Past Medical History:  Diagnosis Date  . Cigar smoker   . Former heavy cigarette smoker (20-39 per day)    Quit 1996  . HLD (hyperlipidemia)   . Obesity      Past Surgical History:  Procedure Laterality Date  . ANGIOPLASTY  2000  . ANTERIOR CRUCIATE LIGAMENT REPAIR Right 1989  . APPENDECTOMY    . KNEE ARTHROSCOPY Right 1980   total of 9 surgeries      Family History  Problem Relation Age of Onset  . Cancer Mother   . Heart disease Father   . Cancer Brother    Social History   Tobacco Use  . Smoking status: Light Tobacco Smoker    Types: Cigars  . Smokeless tobacco: Never Used  Substance Use Topics  . Alcohol use: Yes  . Drug use: No   Review of Systems:   Pertinent items are noted in the HPI. Otherwise, ROS is negative.  Objective:   Vitals:   02/03/18 1413  BP: 128/88  Temp: 98.1 F (36.7 C)   Body mass index is 33.15 kg/m.  General  Alert, cooperative, no distress, appears stated age  Head:  Normocephalic, without obvious abnormality, atraumatic  Eyes:  PERRL, conjunctiva/corneas clear, EOM's intact, fundi benign, both eyes       Ears:  Normal TM's and external ear canals, both ears  Nose: Nares normal, septum midline, mucosa normal, no drainage or sinus tenderness  Throat: Lips, mucosa, and tongue normal; teeth and gums normal  Neck: Supple, symmetrical, trachea midline, no adenopathy; thyroid: no enlargement/tenderness/nodules; no carotid bruit or JVD  Back:   Symmetric, no curvature, ROM normal, no CVA  tenderness  Lungs:   Clear to auscultation bilaterally, respirations unlabored  Chest Wall:  No tenderness or deformity  Heart:  Regular rate and rhythm, S1 and S2 normal, no murmur, rub or gallop  Abdomen:   Soft, non-tender, bowel sounds active all four quadrants, no masses, no organomegaly  Extremities: Extremities normal, atraumatic, no cyanosis or edema  Prostate : Not done  Skin: Skin color, texture, turgor normal, no rashes or lesions  Lymph: Cervical, supraclavicular, and axillary nodes normal  Neurologic: CNII-XII grossly intact. Normal strength, sensation and reflexes throughout    AssessmentPlan:   Diagnoses and all orders for this visit:  Routine physical examination  Hyperlipidemia, unspecified hyperlipidemia type -     Comprehensive metabolic panel -     Lipid panel -     CT CARDIAC SCORING; Future -     hydrochlorothiazide (MICROZIDE) 12.5 MG capsule; Take 1 capsule (12.5 mg total) by mouth daily. -     aspirin EC 81 MG tablet; Take 1 tablet (81 mg total) by mouth daily. -     simvastatin (ZOCOR) 20 MG tablet; Take 1 tablet (20 mg total) by mouth every evening.  Benign prostatic hyperplasia with nocturia -     PSA  Encounter for hepatitis C virus screening test for high risk patient -     Hepatitis C antibody  Tobacco use -  CT CARDIAC SCORING; Future   Patient Counseling: [x]   Nutrition: Stressed importance of moderation in sodium/caffeine intake, saturated fat and cholesterol, caloric balance, sufficient intake of fresh fruits, vegetables, and fiber  [x]   Stressed the importance of regular exercise.   []   Substance Abuse: Discussed cessation/primary prevention of tobacco, alcohol, or other drug use; driving or other dangerous activities under the influence; availability of treatment for abuse.   [x]   Injury prevention: Discussed safety belts, safety helmets, smoke detector, smoking near bedding or upholstery.   []   Sexuality: Discussed sexually transmitted  diseases, partner selection, use of condoms, avoidance of unintended pregnancy  and contraceptive alternatives.   [x]   Dental health: Discussed importance of regular tooth brushing, flossing, and dental visits.  [x]   Health maintenance and immunizations reviewed. Please refer to Health maintenance section.    Helane Rima, DO [facility]

## 2018-02-04 LAB — HEPATITIS C ANTIBODY
Hepatitis C Ab: NONREACTIVE
SIGNAL TO CUT-OFF: 0.01 (ref <1.00)

## 2018-02-07 ENCOUNTER — Encounter: Payer: Self-pay | Admitting: Family Medicine

## 2018-02-09 ENCOUNTER — Other Ambulatory Visit: Payer: Self-pay

## 2018-02-09 DIAGNOSIS — R899 Unspecified abnormal finding in specimens from other organs, systems and tissues: Secondary | ICD-10-CM

## 2018-02-18 ENCOUNTER — Ambulatory Visit (INDEPENDENT_AMBULATORY_CARE_PROVIDER_SITE_OTHER)
Admission: RE | Admit: 2018-02-18 | Discharge: 2018-02-18 | Disposition: A | Discharge status: Home / Self Care | Payer: BLUE CROSS/BLUE SHIELD | Source: Ambulatory Visit | Attending: Family Medicine | Admitting: Family Medicine

## 2018-02-18 DIAGNOSIS — Z720 Tobacco use: Secondary | ICD-10-CM

## 2018-02-18 DIAGNOSIS — E785 Hyperlipidemia, unspecified: Secondary | ICD-10-CM

## 2018-04-02 ENCOUNTER — Encounter: Payer: Self-pay | Admitting: Family Medicine

## 2018-04-02 DIAGNOSIS — R351 Nocturia: Principal | ICD-10-CM

## 2018-04-02 DIAGNOSIS — N401 Enlarged prostate with lower urinary tract symptoms: Secondary | ICD-10-CM

## 2018-06-27 ENCOUNTER — Ambulatory Visit (HOSPITAL_COMMUNITY)
Admission: EM | Admit: 2018-06-27 | Discharge: 2018-06-27 | Disposition: A | Discharge status: Home / Self Care | Payer: BLUE CROSS/BLUE SHIELD

## 2018-06-27 ENCOUNTER — Other Ambulatory Visit: Payer: Self-pay

## 2018-06-27 ENCOUNTER — Encounter (HOSPITAL_COMMUNITY): Payer: Self-pay | Admitting: Emergency Medicine

## 2018-06-27 DIAGNOSIS — S91301A Unspecified open wound, right foot, initial encounter: Secondary | ICD-10-CM | POA: Diagnosis not present

## 2018-06-27 MED ORDER — MUPIROCIN 2 % EX OINT: 1.0000 "application " | TOPICAL_OINTMENT | Freq: Two times a day (BID) | CUTANEOUS | 0 refills | Status: DC

## 2018-06-27 MED ORDER — DOXYCYCLINE HYCLATE 100 MG PO CAPS: 100.0000 mg | ORAL_CAPSULE | Freq: Two times a day (BID) | ORAL | 0 refills | Status: DC

## 2018-06-27 NOTE — ED Provider Notes (Signed)
[facility]    CSN: [id number] Arrival date & time: 06/27/18  1234     History   Chief [id number] Chief Complaint  Patient presents with  . Appointment    1:00 pm  . Foot Pain    HPI Bobby Hale is a 62 y.o. male.   62 year old male comes in for few day history of blister to the right heel.  States was wearing new boots, and caused a blister.  Blister opened due to friction.  For the past 2 days, has noticed erythema around the wound with increased swelling of the ankle.  States swelling slightly decreases after elevation/overnight.  Denies spreading erythema.  Denies fever.  Cleaned wound with hydrogen peroxide, and has been dressing wound with Neosporin.  Denies history of diabetes.     Past Medical History:  Diagnosis Date  . Cigar smoker   . Former heavy cigarette smoker (20-39 per day)    Quit 1996  . HLD (hyperlipidemia)   . Obesity     Patient Active Problem List   Diagnosis Date Noted  . Former smoker, stopped smoking many years ago 09/02/2016  . Class 1 obesity due to excess calories without serious comorbidity with body mass index (BMI) of 31.0 to 31.9 in adult 09/02/2016  . Hyperlipidemia 09/02/2016  . Benign prostatic hyperplasia with nocturia 09/02/2016    Past Surgical History:  Procedure Laterality Date  . ANGIOPLASTY  2000  . ANTERIOR CRUCIATE LIGAMENT REPAIR Right 1989  . APPENDECTOMY    . KNEE ARTHROSCOPY Right 1980   total of 9 surgeries        Home Medications    Prior to Admission medications   Medication Sig Start Date End Date Taking? Authorizing Provider  aspirin EC 81 MG tablet Take 1 tablet (81 mg total) by mouth daily. 02/03/18  Yes Helane RimaWallace, Erica, DO  hydrochlorothiazide (HYDRODIURIL) 12.5 MG tablet Take 12.5 mg by mouth daily.   Yes [provider]  simvastatin (ZOCOR) 20 MG tablet Take 1 tablet (20 mg total) by mouth every evening. 02/03/18  Yes Helane RimaWallace, Erica, DO  doxycycline (VIBRAMYCIN) 100 MG capsule  Take 1 capsule (100 mg total) by mouth 2 (two) times daily. 06/27/18   Cathie HoopsYu, Aundrey Elahi V, PA-C  mupirocin ointment (BACTROBAN) 2 % Apply 1 application topically 2 (two) times daily. 06/27/18   Belinda FisherYu, Jontue Crumpacker V, PA-C    Family History Family History  Problem Relation Age of Onset  . Cancer Mother   . Heart disease Father   . Cancer Brother     Social History Social History   Tobacco Use  . Smoking status: Light Tobacco Smoker    Types: Cigars  . Smokeless tobacco: Never Used  Substance Use Topics  . Alcohol use: Yes  . Drug use: No     Allergies   Penicillins   Review of Systems Review of Systems  Reason unable to perform ROS: See HPI as above.     Physical Exam Triage Vital Signs ED Triage Vitals  Enc Vitals Group     BP 06/27/18 1259 (!) 144/77     Pulse Rate 06/27/18 1259 77     Resp 06/27/18 1259 18     Temp 06/27/18 1259 98.3 F (36.8 C)     Temp Source 06/27/18 1259 Oral     SpO2 06/27/18 1259 97 %     Weight --      Height --      Head Circumference --  Peak Flow --      Pain Score 06/27/18 1301 2     Pain Loc --      Pain Edu? --      Excl. in GC? --    No data found.  Updated Vital Signs BP (!) 144/77 (BP Location: Right Arm)   Pulse 77   Temp 98.3 F (36.8 C) (Oral)   Resp 18   SpO2 97%   Physical Exam  Constitutional: He is oriented to person, place, and time. He appears well-developed and well-nourished. No distress.  HENT:  Head: Normocephalic and atraumatic.  Eyes: Pupils are equal, round, and reactive to light. Conjunctivae are normal.  Musculoskeletal:  1 cm x 1 cm round open wound on the posterior right heel.  Surrounding erythema without obvious warmth.  Mild tenderness to palpation.  Diffuse swelling to the ankle.  Full range of motion.  Sensation intact.  Pedal pulse 2+, cap refill less than 2 seconds.  Neurological: He is alert and oriented to person, place, and time.  Skin: He is not diaphoretic.     UC Treatments / Results   Labs (all labs ordered are listed, but only abnormal results are displayed) Labs Reviewed - No data to display  EKG None  Radiology No results found.  Procedures Procedures (including critical care time)  Medications Ordered in UC Medications - No data to display  Initial Impression / Assessment and Plan / UC Course  I have reviewed the triage vital signs and the nursing notes.  Pertinent labs & imaging results that were available during my care of the patient were reviewed by me and considered in my medical decision making (see chart for details).    Start doxycycline as directed for possible infection.  Wound care instructions given.  Will have patient use Ace wrap to help with swelling.  Return precautions given.  Patient expresses understanding and agrees to plan.  Final Clinical Impressions(s) / UC Diagnoses   Final diagnoses:  Open wound of right heel, initial encounter    ED Prescriptions    Medication Sig Dispense Auth. Provider   mupirocin ointment (BACTROBAN) 2 % Apply 1 application topically 2 (two) times daily. 22 g Murline Weigel V, PA-C   doxycycline (VIBRAMYCIN) 100 MG capsule Take 1 capsule (100 mg total) by mouth 2 (two) times daily. 20 capsule Threasa Alpha, [place] 06/27/18 1333

## 2018-06-27 NOTE — ED Triage Notes (Signed)
Monday started wearing new boots.  Next day developed blister and since then symptoms have worsened.  Blister was on back of right foot, now an open wound.  Right ankle is swollen and there is redness.

## 2018-06-27 NOTE — Discharge Instructions (Signed)
Start doxycycline to cover for infection.  As discussed, redness could also be due to reaction with Neosporin, please discontinue.  You can dress wound with Bactroban.  Keep wound clean and dry, wash gently with soap and water, no need for hydrogen peroxide at this time.  Daily dressing.  Can use Ace wrap to help with swelling.  Monitor for spreading redness, increased warmth, fever, follow-up for reevaluation needed.

## 2018-08-25 DIAGNOSIS — H52223 Regular astigmatism, bilateral: Secondary | ICD-10-CM | POA: Insufficient documentation

## 2018-08-25 DIAGNOSIS — H43393 Other vitreous opacities, bilateral: Secondary | ICD-10-CM | POA: Insufficient documentation

## 2018-08-25 DIAGNOSIS — H2513 Age-related nuclear cataract, bilateral: Secondary | ICD-10-CM | POA: Insufficient documentation

## 2018-10-25 IMAGING — CT CT HEART SCORING
2 series · 16 of 20 positions shown, 18 images · non-contrast
Comparison: None.

CLINICAL DATA: Risk stratification

EXAM:
Coronary Calcium Score
TECHNIQUE: The patient was scanned on a Siemens Somatom 64 slice scanner. Axial
non-contrast 3 mm slices were carried out through the heart. The
data set was analyzed on a dedicated work station and scored using
the Agatson method.

[Series 3: casc 3.0 i36f 2 bestdiast 64 % · axial · 0.44mm/px · z∈[-323,-200]mm · 8 of 53 slices shown, 10 images]
[im 6/53  vessel]
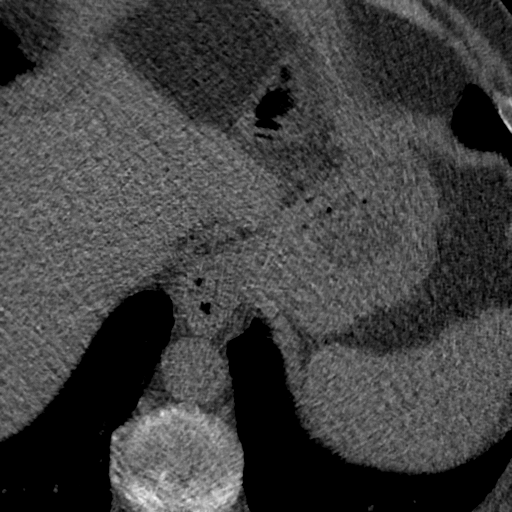
[im 6/53  lung]
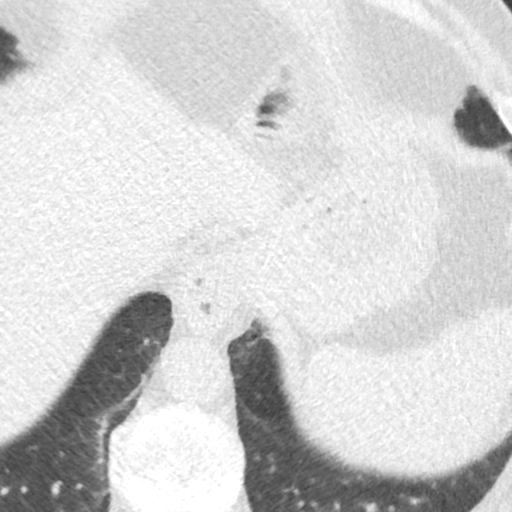
[im 12/53  vessel]
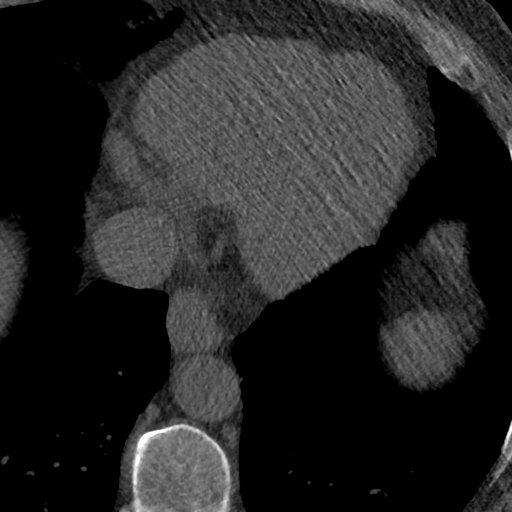
[im 18/53  vessel]
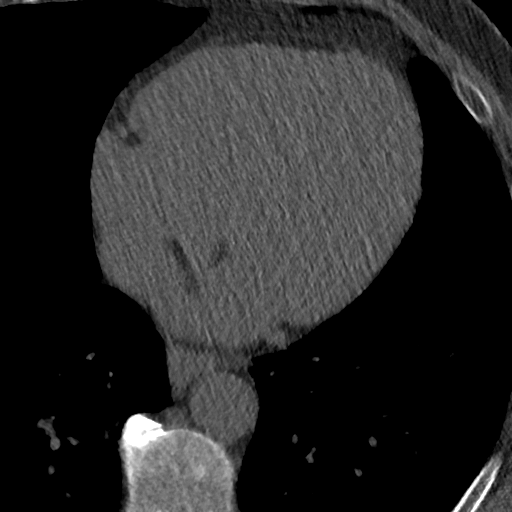
[im 24/53  vessel]
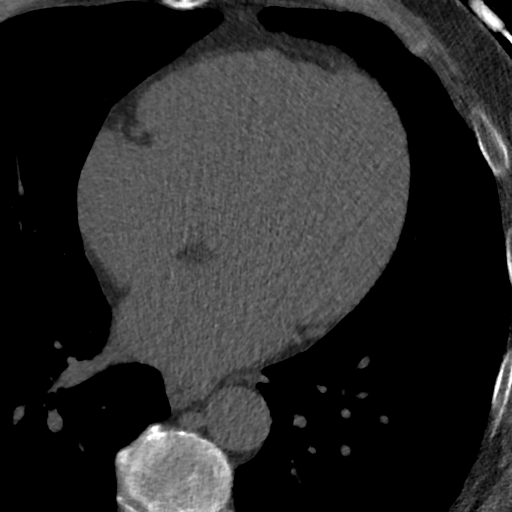
[im 29/53  vessel]
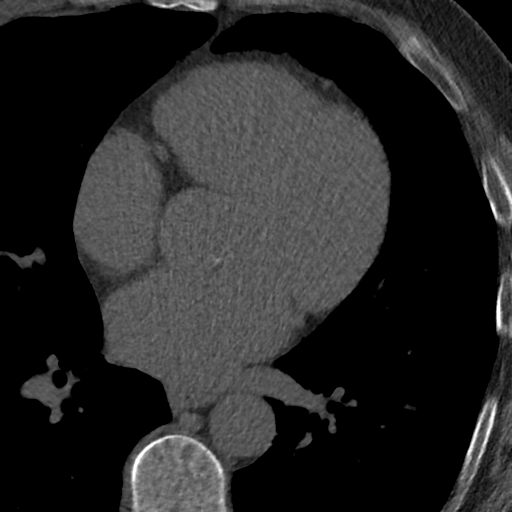
[im 29/53  lung]
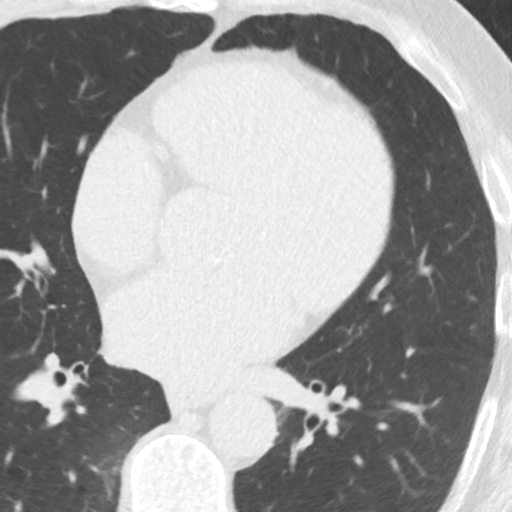
[im 35/53  vessel]
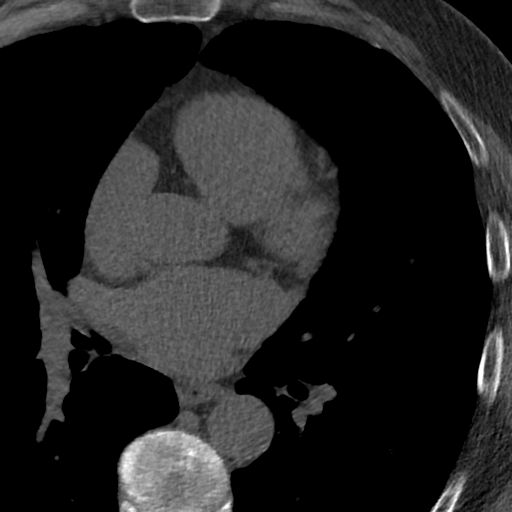
[im 41/53  vessel]
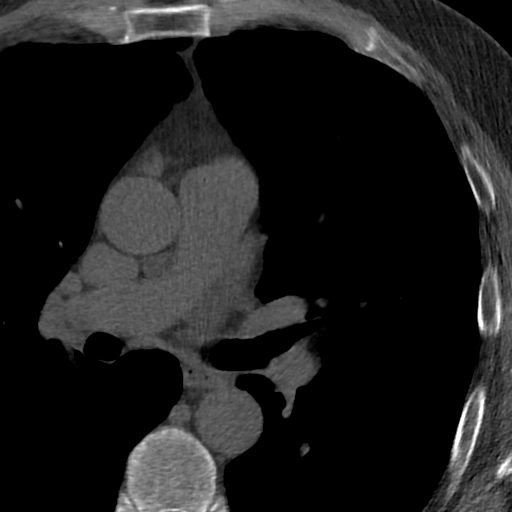
[im 47/53  vessel]
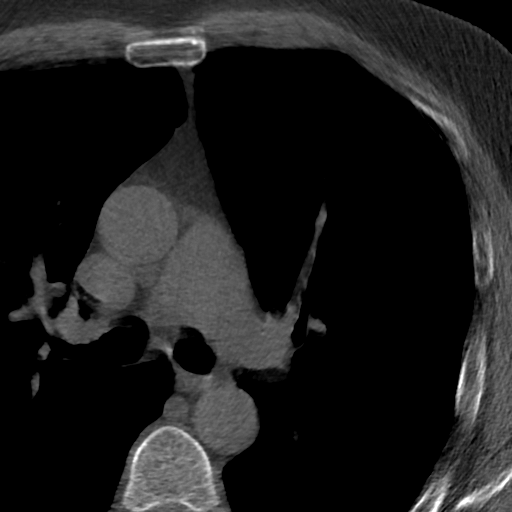

[Series 5: lung st 64 % · axial · 0.68mm/px · z∈[-320,-194]mm · 8 of 55 slices shown]
[im 7/55  lung]
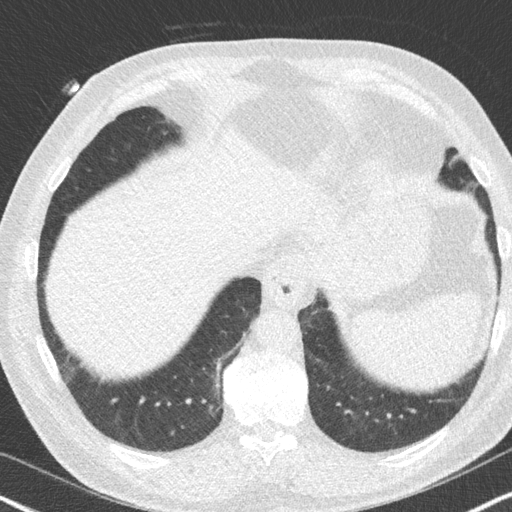
[im 13/55  lung]
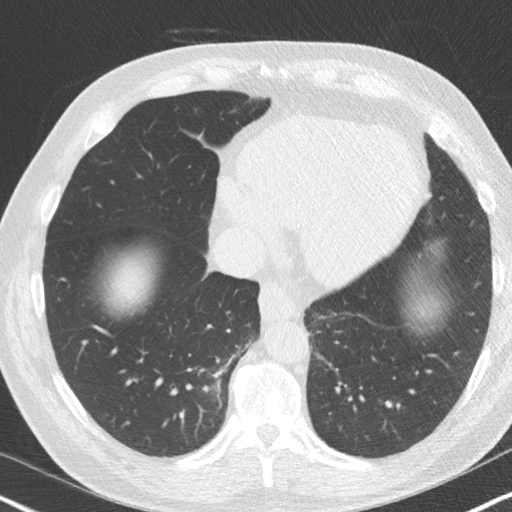
[im 19/55  lung]
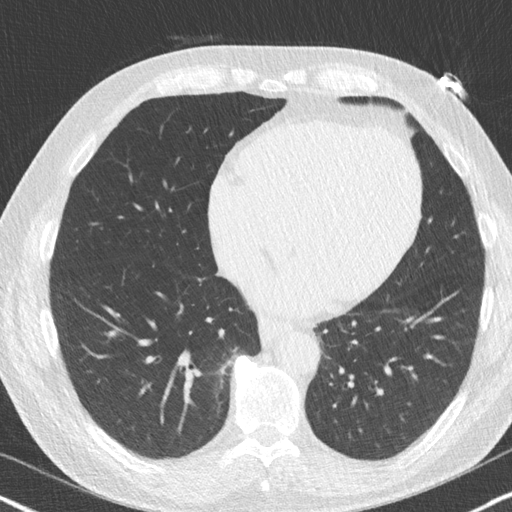
[im 25/55  lung]
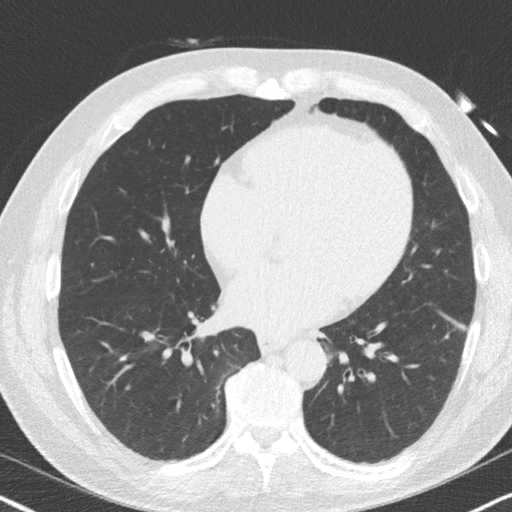
[im 31/55  lung]
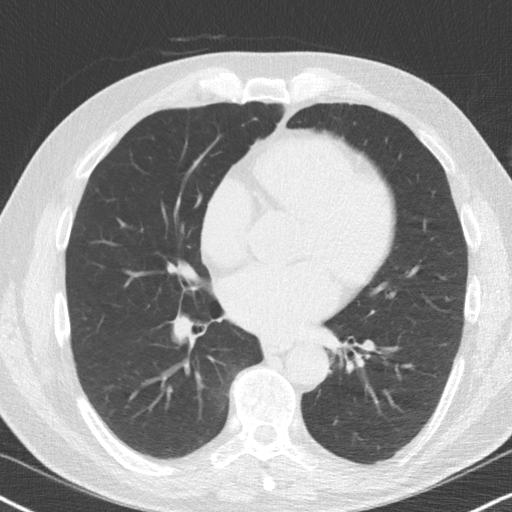
[im 37/55  lung]
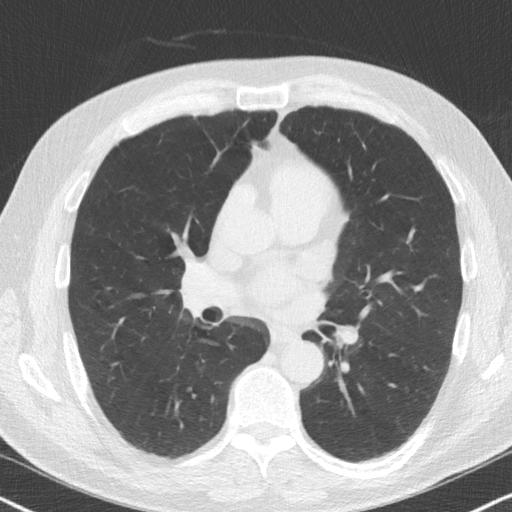
[im 43/55  lung]
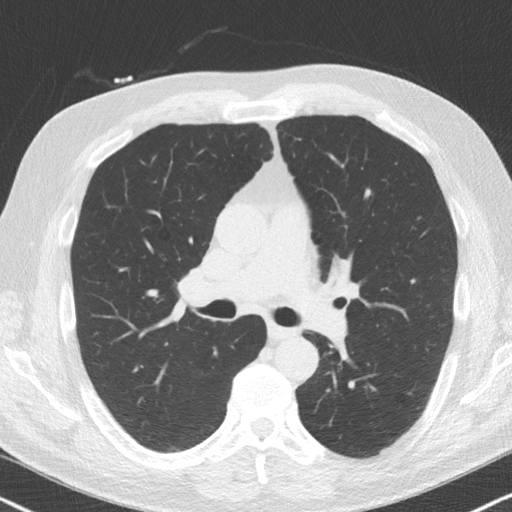
[im 49/55  lung]
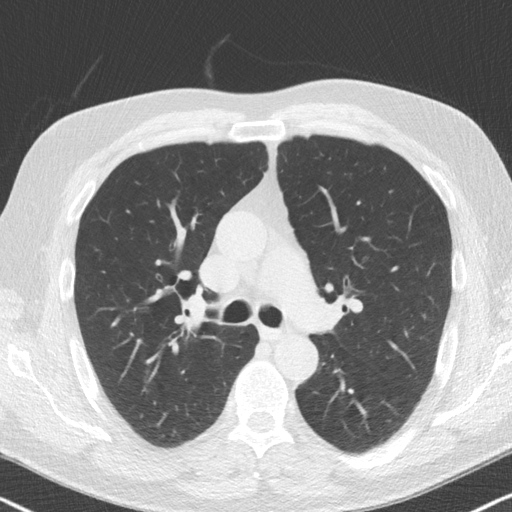

[16 of 20 positions shown; findings below may reference images not displayed]

FINDINGS: Non-cardiac: See separate report from [REDACTED].

Ascending Aorta: Normal diameter 3.3 cm

Pericardium: Normal

Coronary arteries: One small isolated punctate area of calcium noted
in mid RCA
IMPRESSION: Coronary calcium score of 1. This was 29 th percentile for age and
sex matched control.

Celestin Laines

EXAM:
OVER-READ INTERPRETATION  CT CHEST

The following report is an over-read performed by radiologist Dr.
Rudi Jumper [REDACTED] on 02/18/2018. This over-read
does not include interpretation of cardiac or coronary anatomy or
pathology. The coronary calcium score interpretation by the
cardiologist is attached.
FINDINGS: Vascular: Heart is normal size.  Visualized aorta normal caliber.

Mediastinum/Nodes: No adenopathy in the lower mediastinum or hila.
Small hiatal hernia.

Lungs/Pleura: Visualized lungs clear.  No effusions.

Upper Abdomen: Imaging into the upper abdomen shows no acute
findings.

Musculoskeletal: Chest wall soft tissues are unremarkable. No acute
bony abnormality.
IMPRESSION: No acute or significant extracardiac abnormality.

## 2019-02-05 ENCOUNTER — Encounter: Payer: BLUE CROSS/BLUE SHIELD | Admitting: Family Medicine

## 2019-02-18 ENCOUNTER — Other Ambulatory Visit: Payer: Self-pay | Admitting: Family Medicine

## 2019-02-18 DIAGNOSIS — E785 Hyperlipidemia, unspecified: Secondary | ICD-10-CM

## 2019-03-17 ENCOUNTER — Encounter: Payer: Self-pay | Admitting: Family Medicine

## 2019-03-25 NOTE — Progress Notes (Deleted)
Subjective:    Bobby Hale is a 63 y.o. male and is here for a comprehensive physical exam.  Health Maintenance Due  Topic Date Due  . INFLUENZA VACCINE  02/20/2019     Current Outpatient Medications:  .  aspirin 81 MG EC tablet, TAKE 1 TABLET BY MOUTH EVERY DAY, Disp: 90 tablet, Rfl: 3 .  doxycycline (VIBRAMYCIN) 100 MG capsule, Take 1 capsule (100 mg total) by mouth 2 (two) times daily., Disp: 20 capsule, Rfl: 0 .  hydrochlorothiazide (HYDRODIURIL) 12.5 MG tablet, Take 12.5 mg by mouth daily., Disp: , Rfl:  .  hydrochlorothiazide (MICROZIDE) 12.5 MG capsule, TAKE 1 CAPSULE BY MOUTH EVERY DAY, Disp: 90 capsule, Rfl: 3 .  mupirocin ointment (BACTROBAN) 2 %, Apply 1 application topically 2 (two) times daily., Disp: 22 g, Rfl: 0 .  simvastatin (ZOCOR) 20 MG tablet, TAKE 1 TABLET BY MOUTH EVERY DAY IN THE EVENING, Disp: 90 tablet, Rfl: 3  PMHx, SurgHx, SocialHx, Medications, and Allergies were reviewed in the Visit Navigator and updated as appropriate.   Past Medical History:  Diagnosis Date  . Cigar smoker   . Former heavy cigarette smoker (20-39 per day)    Quit 1996  . HLD (hyperlipidemia)   . Obesity     Past Surgical History:  Procedure Laterality Date  . ANGIOPLASTY  2000  . ANTERIOR CRUCIATE LIGAMENT REPAIR Right 1989  . APPENDECTOMY    . KNEE ARTHROSCOPY Right 1980   total of 9 surgeries     Family History  Problem Relation Age of Onset  . Cancer Mother   . Heart disease Father   . Cancer Brother     Social History   Tobacco Use  . Smoking status: Light Tobacco Smoker    Types: Cigars  . Smokeless tobacco: Never Used  Substance Use Topics  . Alcohol use: Yes  . Drug use: No    Review of Systems:   Pertinent items are noted in the HPI. Otherwise, ROS is negative.  Objective:   There were no vitals taken for this visit.  General appearance: alert, cooperative and appears stated age. Head: normocephalic, without obvious abnormality,  atraumatic. Neck: no adenopathy, supple, symmetrical, trachea midline; thyroid not enlarged, symmetric, no tenderness/mass/nodules. Lungs: clear to auscultation bilaterally. Breasts: inspection negative, no nipple retraction or dimpling, no nipple discharge or bleeding, no axillary or supraclavicular adenopathy, normal to palpation without dominant masses. Heart: regular rate and rhythm Abdomen: soft, non-tender; no masses,  no organomegaly. Extremities: extremities normal, atraumatic, no cyanosis or edema. Skin: skin color, texture, turgor normal, no rashes or lesions. Lymph: cervical, supraclavicular, and axillary nodes normal; no abnormal inguinal nodes palpated. Neurologic: grossly normal.  Pelvic:  External genitalia: no lesions. Urethra: normal appearing urethra with no masses, tenderness or lesions. Bartholin's and Skene's: normal. Vagina: normal appearing vagina with normal color and discharge, no lesions. Cervix: normal appearance. Pap and high risk HPV testing done: {yes no:[id number]} Uterus: uterus is normal size, shape, consistency and nontender. Adnexa: normal adnexa in size, nontender and no masses.                                      Assessment/Plan:   There are no diagnoses linked to this encounter.  Patient Counseling: [x]    Nutrition: Stressed importance of moderation in sodium/caffeine intake, saturated fat and cholesterol, caloric balance, sufficient intake of fresh fruits, vegetables, fiber, calcium, iron, and  1 mg of folate supplement per day (for females capable of pregnancy).  [x]    Stressed the importance of regular exercise.   [x]    Substance Abuse: Discussed cessation/primary prevention of tobacco, alcohol, or other drug use; driving or other dangerous activities under the influence; availability of treatment for abuse.   [x]    Injury prevention: Discussed safety belts, safety helmets, smoke detector, smoking near bedding or upholstery.   [x]    Sexuality: Discussed  sexually transmitted diseases, partner selection, use of condoms, avoidance of unintended pregnancy  and contraceptive alternatives.  [x]    Dental health: Discussed importance of regular tooth brushing, flossing, and dental visits.  [x]    Health maintenance and immunizations reviewed. Please refer to Health maintenance section.   Briscoe Deutscher, DO [place]

## 2019-03-26 ENCOUNTER — Encounter: Payer: Self-pay | Admitting: Family Medicine

## 2019-04-11 NOTE — Progress Notes (Signed)
Subjective:    Donivan ScullFrancis Rouse is a 63 y.o. male who presents today for his Complete Annual Exam.   Current Outpatient Medications:  .  hydrochlorothiazide (MICROZIDE) 12.5 MG capsule, TAKE 1 CAPSULE BY MOUTH EVERY DAY, Disp: 90 capsule, Rfl: 3 .  SB LOW DOSE ASA EC 81 MG EC tablet, TAKE 1 TABLET BY MOUTH EVERY DAY, Disp: , Rfl:  .  simvastatin (ZOCOR) 20 MG tablet, TAKE 1 TABLET BY MOUTH EVERY DAY IN THE EVENING, Disp: 90 tablet, Rfl: 3  There are no preventive care reminders to display for this patient.  PMHx, SurgHx, SocialHx, Medications, and Allergies were reviewed in the Visit Navigator and updated as appropriate.   Past Medical History:  Diagnosis Date  . Cigar smoker   . Former heavy cigarette smoker (20-39 per day)    Quit 1996  . HLD (hyperlipidemia)   . Obesity      Past Surgical History:  Procedure Laterality Date  . ANGIOPLASTY  2000  . ANTERIOR CRUCIATE LIGAMENT REPAIR Right 1989  . APPENDECTOMY    . KNEE ARTHROSCOPY Right 1980   total of 9 surgeries      Family History  Problem Relation Age of Onset  . Cancer Mother   . Heart disease Father   . Cancer Brother     Social History   Tobacco Use  . Smoking status: Light Tobacco Smoker    Types: Cigars  . Smokeless tobacco: Never Used  Substance Use Topics  . Alcohol use: Yes  . Drug use: No    Review of Systems:   Pertinent items are noted in the HPI. Otherwise, ROS is negative.  Objective:    Vitals:   04/12/19 0757 04/12/19 0808  BP: (!) 178/100 128/80  Pulse: 71   Temp: (!) 97.3 F (36.3 C)   SpO2: 96%    Body mass index is 32.82 kg/m.  General  Alert, cooperative, no distress, appears stated age  Head:  Normocephalic, without obvious abnormality, atraumatic  Eyes:  PERRL, conjunctiva/corneas clear, EOM's intact, fundi benign, both eyes       Ears:  Normal TM's and external ear canals, both ears  Nose: Nares normal, septum midline, mucosa normal, no drainage or sinus  tenderness  Throat: Lips, mucosa, and tongue normal; teeth and gums normal  Neck: Supple, symmetrical, trachea midline, no adenopathy; thyroid: no enlargement/tenderness/nodules; no carotid bruit or JVD  Back:   Symmetric, no curvature, ROM normal, no CVA tenderness  Lungs:   Clear to auscultation bilaterally, respirations unlabored  Chest Wall:  No tenderness or deformity  Heart:  Regular rate and rhythm, S1 and S2 normal, no murmur, rub or gallop  Abdomen:   Soft, non-tender, bowel sounds active all four quadrants, no masses, no organomegaly  Extremities: Extremities normal, atraumatic, no cyanosis or edema  Prostate: Not done  Skin: Skin color, texture, turgor normal, no rashes or lesions  Lymph: Cervical, supraclavicular, and axillary nodes normal  Neurologic: CNII-XII grossly intact. Normal strength, sensation and reflexes throughout   AssessmentPlan:   [name] was seen today for annual exam.  Diagnoses and all orders for this visit:  Routine physical examination  Class 1 obesity due to excess calories without serious comorbidity with body mass index (BMI) of 31.0 to 31.9 in adult  Pure hypercholesterolemia -     Lipid panel  Benign prostatic hyperplasia with nocturia -     PSA -     tamsulosin (FLOMAX) 0.4 MG CAPS capsule; Take 1 capsule (0.4  mg total) by mouth daily. -     Ambulatory referral to Urology  Edema of left lower extremity -     CBC with Differential/Platelet -     Comprehensive metabolic panel -     Magnesium  Need for diphtheria-tetanus-pertussis (Tdap) vaccine -     Tdap vaccine greater than or equal to 7yo IM  Need for immunization against influenza -     Flu Vaccine QUAD 36+ mos IM   Patient Counseling: [x]   Nutrition: Stressed importance of moderation in sodium/caffeine intake, saturated fat and cholesterol, caloric balance, sufficient intake of fresh fruits, vegetables, and fiber  [x]   Stressed the importance of regular exercise.   []   Substance  Abuse: Discussed cessation/primary prevention of tobacco, alcohol, or other drug use; driving or other dangerous activities under the influence; availability of treatment for abuse.   [x]   Injury prevention: Discussed safety belts, safety helmets, smoke detector, smoking near bedding or upholstery.   []   Sexuality: Discussed sexually transmitted diseases, partner selection, use of condoms, avoidance of unintended pregnancy  and contraceptive alternatives.   [x]   Dental health: Discussed importance of regular tooth brushing, flossing, and dental visits.  [x]   Health maintenance and immunizations reviewed. Please refer to Health maintenance section.   Briscoe Deutscher, DO [place]

## 2019-04-12 ENCOUNTER — Encounter: Payer: Self-pay | Admitting: Family Medicine

## 2019-04-12 ENCOUNTER — Other Ambulatory Visit: Payer: Self-pay

## 2019-04-12 ENCOUNTER — Ambulatory Visit (INDEPENDENT_AMBULATORY_CARE_PROVIDER_SITE_OTHER): Payer: PRIVATE HEALTH INSURANCE | Admitting: Family Medicine

## 2019-04-12 VITALS — BP 128/80 | HR 71 | Temp 97.3°F | Ht 75.0 in | Wt 262.6 lb

## 2019-04-12 DIAGNOSIS — R351 Nocturia: Secondary | ICD-10-CM

## 2019-04-12 DIAGNOSIS — Z6831 Body mass index (BMI) 31.0-31.9, adult: Secondary | ICD-10-CM

## 2019-04-12 DIAGNOSIS — Z0000 Encounter for general adult medical examination without abnormal findings: Secondary | ICD-10-CM

## 2019-04-12 DIAGNOSIS — Z23 Encounter for immunization: Secondary | ICD-10-CM

## 2019-04-12 DIAGNOSIS — R600 Localized edema: Secondary | ICD-10-CM

## 2019-04-12 DIAGNOSIS — E6609 Other obesity due to excess calories: Secondary | ICD-10-CM

## 2019-04-12 DIAGNOSIS — E7800 Pure hypercholesterolemia, unspecified: Secondary | ICD-10-CM

## 2019-04-12 DIAGNOSIS — N401 Enlarged prostate with lower urinary tract symptoms: Secondary | ICD-10-CM

## 2019-04-12 LAB — LIPID PANEL
Cholesterol: 189 mg/dL (ref 0–200)
HDL: 45.4 mg/dL (ref >39.00)
LDL Cholesterol: 106 mg/dL — ABNORMAL HIGH (ref 0–99)
NonHDL: 143.25
Total CHOL/HDL Ratio: 4
Triglycerides: 184 mg/dL — ABNORMAL HIGH (ref 0.0–149.0)
VLDL: 36.8 mg/dL (ref 0.0–40.0)

## 2019-04-12 LAB — COMPREHENSIVE METABOLIC PANEL
ALT: 20 U/L (ref 0–53)
AST: 14 U/L (ref 0–37)
Albumin: 4.5 g/dL (ref 3.5–5.2)
Alkaline Phosphatase: 72 U/L (ref 39–117)
BUN: 14 mg/dL (ref 6–23)
CO2: 28 mEq/L (ref 19–32)
Calcium: 9.7 mg/dL (ref 8.4–10.5)
Chloride: 101 mEq/L (ref 96–112)
Creatinine, Ser: 0.96 mg/dL (ref 0.40–1.50)
GFR: 79.11 mL/min (ref >60.00)
Glucose, Bld: 76 mg/dL (ref 70–99)
Potassium: 4.1 mEq/L (ref 3.5–5.1)
Sodium: 137 mEq/L (ref 135–145)
Total Bilirubin: 0.6 mg/dL (ref 0.2–1.2)
Total Protein: 6.9 g/dL (ref 6.0–8.3)

## 2019-04-12 LAB — CBC WITH DIFFERENTIAL/PLATELET
Basophils Absolute: 0 10*3/uL (ref 0.0–0.1)
Basophils Relative: 0.8 % (ref 0.0–3.0)
Eosinophils Absolute: 0.2 10*3/uL (ref 0.0–0.7)
Eosinophils Relative: 2.6 % (ref 0.0–5.0)
HCT: 42.8 % (ref 39.0–52.0)
Hemoglobin: 14.9 g/dL (ref 13.0–17.0)
Lymphocytes Relative: 28.7 % (ref 12.0–46.0)
Lymphs Abs: 1.7 10*3/uL (ref 0.7–4.0)
MCHC: 34.8 g/dL (ref 30.0–36.0)
MCV: 89.3 fl (ref 78.0–100.0)
Monocytes Absolute: 0.5 10*3/uL (ref 0.1–1.0)
Monocytes Relative: 8 % (ref 3.0–12.0)
Neutro Abs: 3.6 10*3/uL (ref 1.4–7.7)
Neutrophils Relative %: 59.9 % (ref 43.0–77.0)
Platelets: 245 10*3/uL (ref 150.0–400.0)
RBC: 4.79 Mil/uL (ref 4.22–5.81)
RDW: 12.8 % (ref 11.5–15.5)
WBC: 6 10*3/uL (ref 4.0–10.5)

## 2019-04-12 LAB — PSA: PSA: 8.05 ng/mL — ABNORMAL HIGH (ref 0.10–4.00)

## 2019-04-12 LAB — MAGNESIUM: Magnesium: 2.1 mg/dL (ref 1.5–2.5)

## 2019-04-12 MED ORDER — TAMSULOSIN HCL 0.4 MG PO CAPS: 0.4000 mg | ORAL_CAPSULE | Freq: Every day | ORAL | 3 refills | Status: DC

## 2019-04-12 NOTE — Patient Instructions (Signed)
There are no preventive care reminders to display for this patient.  Depression screen [facility] 2/9 04/12/2019 02/07/2018  Decreased Interest 0 0  Down, Depressed, Hopeless 0 0  PHQ - 2 Score 0 0  Altered sleeping 1 -  Tired, decreased energy 0 -  Change in appetite 0 -  Feeling bad or failure about yourself  0 -  Trouble concentrating 0 -  Moving slowly or fidgety/restless 0 -  Suicidal thoughts 0 -  PHQ-9 Score 1 -  Difficult doing work/chores Not difficult at all -

## 2019-04-28 ENCOUNTER — Encounter: Payer: Self-pay | Admitting: Family Medicine

## 2019-04-29 ENCOUNTER — Encounter: Payer: Self-pay | Admitting: Family Medicine

## 2019-05-26 ENCOUNTER — Other Ambulatory Visit: Payer: Self-pay

## 2019-05-26 DIAGNOSIS — Z20822 Contact with and (suspected) exposure to covid-19: Secondary | ICD-10-CM

## 2019-05-27 LAB — NOVEL CORONAVIRUS, NAA: SARS-CoV-2, NAA: NOT DETECTED

## 2019-08-08 ENCOUNTER — Other Ambulatory Visit: Payer: Self-pay | Admitting: Family Medicine

## 2019-08-08 DIAGNOSIS — N401 Enlarged prostate with lower urinary tract symptoms: Secondary | ICD-10-CM

## 2019-08-08 DIAGNOSIS — R351 Nocturia: Secondary | ICD-10-CM

## 2019-08-11 NOTE — Telephone Encounter (Signed)
Please review

## 2019-08-17 NOTE — Telephone Encounter (Signed)
Needs f/u app

## 2019-08-18 NOTE — Telephone Encounter (Signed)
Left a voicemail to have patient call us back for a follow up.

## 2019-11-27 ENCOUNTER — Other Ambulatory Visit: Payer: Self-pay | Admitting: Family Medicine

## 2019-11-27 DIAGNOSIS — N401 Enlarged prostate with lower urinary tract symptoms: Secondary | ICD-10-CM

## 2019-11-29 NOTE — Telephone Encounter (Signed)
Please review

## 2020-02-21 ENCOUNTER — Other Ambulatory Visit: Payer: Self-pay | Admitting: Family Medicine

## 2020-02-21 DIAGNOSIS — E785 Hyperlipidemia, unspecified: Secondary | ICD-10-CM

## 2020-03-08 ENCOUNTER — Encounter: Payer: Self-pay | Admitting: Physician Assistant

## 2020-03-08 ENCOUNTER — Other Ambulatory Visit: Payer: Self-pay

## 2020-03-08 ENCOUNTER — Ambulatory Visit (INDEPENDENT_AMBULATORY_CARE_PROVIDER_SITE_OTHER): Payer: PRIVATE HEALTH INSURANCE | Admitting: Physician Assistant

## 2020-03-08 VITALS — BP 130/86 | HR 63 | Temp 97.2°F | Resp 16 | Ht 75.0 in | Wt 267.0 lb

## 2020-03-08 DIAGNOSIS — R3911 Hesitancy of micturition: Secondary | ICD-10-CM

## 2020-03-08 DIAGNOSIS — N401 Enlarged prostate with lower urinary tract symptoms: Secondary | ICD-10-CM | POA: Diagnosis not present

## 2020-03-08 DIAGNOSIS — R600 Localized edema: Secondary | ICD-10-CM | POA: Diagnosis not present

## 2020-03-08 NOTE — Progress Notes (Signed)
Patient presents to clinic today to transfer care from Dr. Helane Rima who was previously at our [facility] location.   Patient with history of significant BPH symptoms including urinary hesitancy and nocturia. Previously on Flomax without much improvement in symptoms. As such was referred to Urology x 2 by previous PCP but did not complete appointments. States he would like to see Urology now. Denies dysuria, urinary urgency or hematuria.   Lab Results  Component Value Date   PSA 8.05 (H) 04/12/2019   PSA 8.74 (H) 02/03/2018   PSA 4.87 (H) 09/02/2016   Patient also with history of intermittent swelling of feet and ankles. 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 Was put on HCTZ 12.5 mg by PCP which has worked very well for him. Patient denies chest pain, palpitations, lightheadedness, dizziness, vision changes or frequent headaches.  BP Readings from Last 3 Encounters:  03/08/20 130/86  04/12/19 128/80  06/27/18 (!) 144/77   Past Medical History:  Diagnosis Date  . Cigar smoker   . Former heavy cigarette smoker (20-39 per day)    Quit 1996  . HLD (hyperlipidemia)   . Obesity     Current Outpatient Medications on File Prior to Visit  Medication Sig Dispense Refill  . hydrochlorothiazide (MICROZIDE) 12.5 MG capsule TAKE 1 CAPSULE BY MOUTH EVERY DAY 90 capsule 3  . simvastatin (ZOCOR) 20 MG tablet TAKE 1 TABLET BY MOUTH EVERY DAY IN THE EVENING 90 tablet 3  . tamsulosin (FLOMAX) 0.4 MG CAPS capsule Take 1 capsule (0.4 mg total) by mouth daily. (Patient not taking: Reported on 03/08/2020) 30 capsule 3   No current facility-administered medications on file prior to visit.    Allergies  Allergen Reactions  . Penicillins     Family History  Problem Relation Age of Onset  . Cancer Mother   . Heart disease Father   . Cancer Brother     Social History   Socioeconomic History  . Marital status: Married    Spouse name: Not on file  . Number of children: 3  .  Years of education: Not on file  . Highest education level: Not on file  Occupational History  . Occupation: Sales  Tobacco Use  . Smoking status: Light Tobacco Smoker    Types: Cigars  . Smokeless tobacco: Never Used  Vaping Use  . Vaping Use: Never used  Substance and Sexual Activity  . Alcohol use: Yes  . Drug use: No  . Sexual activity: Yes  Other Topics Concern  . Not on file  Social History Narrative   Current Social History         Who lives at home: Wife, one son 09/02/2016    Transportation: Owns 09/02/2016   Important Relationships & Pets: Daughter, recently married. One son in college (adopted). One son at home (adopted).  09/02/2016    Work / Education:  Some college 09/02/2016   Interests / Fun: Golf 09/02/2016   Social Determinants of Health   Financial Resource Strain:   . Difficulty of Paying Living Expenses:   Food Insecurity:   . Worried About Programme researcher, broadcasting/film/video in the Last Year:   . Barista in the Last Year:   Transportation Needs:   . Freight forwarder (Medical):   Marland Kitchen Lack of Transportation (Non-Medical):   Physical Activity:   . Days of Exercise per Week:   . Minutes of Exercise per Session:   Stress:   .  Feeling of Stress :   Social Connections:   . Frequency of Communication with Friends and Family:   . Frequency of Social Gatherings with Friends and Family:   . Attends Religious Services:   . Active Member of Clubs or Organizations:   . Attends Banker Meetings:   Marland Kitchen Marital Status:    Review of Systems - See HPI.  All other ROS are negative.  BP 130/86   Pulse 63   Temp (!) 97.2 F (36.2 C) (Temporal)   Resp 16   Ht 6\' 3"  (1.905 m)   Wt 267 lb (121.1 kg)   SpO2 98%   BMI 33.37 kg/m   Physical Exam Vitals reviewed.  Constitutional:      Appearance: Normal appearance.  HENT:     Head: Normocephalic and atraumatic.  Cardiovascular:     Rate and Rhythm: Normal rate and regular rhythm.     Pulses: Normal  pulses.     Heart sounds: Normal heart sounds.  Pulmonary:     Effort: Pulmonary effort is normal.     Breath sounds: Normal breath sounds.  Musculoskeletal:     Cervical back: Neck supple.  Neurological:     General: No focal deficit present.     Mental Status: He is alert and oriented to person, place, and time.  Psychiatric:        Mood and Affect: Mood normal.    Assessment/Plan: 1. Mild peripheral edema Well-controlled with HCTZ. BP stable. Labs up-to-date. Encouraged low sodium diet and exercise in hopes of being able to discontinue medication in the near future.   2. Benign prostatic hyperplasia with urinary hesitancy Ongoing. PSA > 8. Referral placed back to Urology. He promises to follow through with this.  - Ambulatory referral to Urology  This visit occurred during the SARS-CoV-2 public health emergency.  Safety protocols were in place, including screening questions prior to the visit, additional usage of staff PPE, and extensive cleaning of exam room while observing appropriate contact time as indicated for disinfecting solutions.     , PA-C

## 2020-03-08 NOTE — Patient Instructions (Signed)
Please continue medications as directed. Really try to be mindful of what you are eating while on the road -- choose grilled options -- chicken instead of red meat, etc. Salad instead of fries. Increase water intake.  You will be contacted for an appointment with urology for your prostate.   It was very nice meeting you today. Welcome to [employer]!   DASH Eating Plan DASH stands for "Dietary Approaches to Stop Hypertension." The DASH eating plan is a healthy eating plan that has been shown to reduce high blood pressure (hypertension). It may also reduce your risk for type 2 diabetes, heart disease, and stroke. The DASH eating plan may also help with weight loss. What are tips for following this plan?  General guidelines  Avoid eating more than 2,300 mg (milligrams) of salt (sodium) a day. If you have hypertension, you may need to reduce your sodium intake to 1,500 mg a day.  Limit alcohol intake to no more than 1 drink a day for nonpregnant women and 2 drinks a day for men. One drink equals 12 oz of beer, 5 oz of wine, or 1 oz of hard liquor.  Work with your health care provider to maintain a healthy body weight or to lose weight. Ask what an ideal weight is for you.  Get at least 30 minutes of exercise that causes your heart to beat faster (aerobic exercise) most days of the week. Activities may include walking, swimming, or biking.  Work with your health care provider or diet and nutrition specialist (dietitian) to adjust your eating plan to your individual calorie needs. Reading food labels   Check food labels for the amount of sodium per serving. Choose foods with less than 5 percent of the Daily Value of sodium. Generally, foods with less than 300 mg of sodium per serving fit into this eating plan.  To find whole grains, look for the word "whole" as the first word in the ingredient list. Shopping  Buy products labeled as "low-sodium" or "no salt added."  Buy fresh foods. Avoid  canned foods and premade or frozen meals. Cooking  Avoid adding salt when cooking. Use salt-free seasonings or herbs instead of table salt or sea salt. Check with your health care provider or pharmacist before using salt substitutes.  Do not fry foods. Cook foods using healthy methods such as baking, boiling, grilling, and broiling instead.  Cook with heart-healthy oils, such as olive, canola, soybean, or sunflower oil. Meal planning  Eat a balanced diet that includes: ? 5 or more servings of fruits and vegetables each day. At each meal, try to fill half of your plate with fruits and vegetables. ? Up to 6-8 servings of whole grains each day. ? Less than 6 oz of lean meat, poultry, or fish each day. A 3-oz serving of meat is about the same size as a deck of cards. One egg equals 1 oz. ? 2 servings of low-fat dairy each day. ? A serving of nuts, seeds, or beans 5 times each week. ? Heart-healthy fats. Healthy fats called Omega-3 fatty acids are found in foods such as flaxseeds and coldwater fish, like sardines, salmon, and mackerel.  Limit how much you eat of the following: ? Canned or prepackaged foods. ? Food that is high in trans fat, such as fried foods. ? Food that is high in saturated fat, such as fatty meat. ? Sweets, desserts, sugary drinks, and other foods with added sugar. ? Full-fat dairy products.  Do not salt  foods before eating.  Try to eat at least 2 vegetarian meals each week.  Eat more home-cooked food and less restaurant, buffet, and fast food.  When eating at a restaurant, ask that your food be prepared with less salt or no salt, if possible. What foods are recommended? The items listed may not be a complete list. Talk with your dietitian about what dietary choices are best for you. Grains Whole-grain or whole-wheat bread. Whole-grain or whole-wheat pasta. Brown rice. Orpah Cobb. Bulgur. Whole-grain and low-sodium cereals. Pita bread. Low-fat, low-sodium  crackers. Whole-wheat flour tortillas. Vegetables Fresh or frozen vegetables (raw, steamed, roasted, or grilled). Low-sodium or reduced-sodium tomato and vegetable juice. Low-sodium or reduced-sodium tomato sauce and tomato paste. Low-sodium or reduced-sodium canned vegetables. Fruits All fresh, dried, or frozen fruit. Canned fruit in natural juice (without added sugar). Meat and other protein foods Skinless chicken or [place]. Ground chicken or [place]. Pork with fat trimmed off. Fish and seafood. Egg whites. Dried beans, peas, or lentils. Unsalted nuts, nut butters, and seeds. Unsalted canned beans. Lean cuts of beef with fat trimmed off. Low-sodium, lean deli meat. Dairy Low-fat (1%) or fat-free (skim) milk. Fat-free, low-fat, or reduced-fat cheeses. Nonfat, low-sodium ricotta or cottage cheese. Low-fat or nonfat yogurt. Low-fat, low-sodium cheese. Fats and oils Soft margarine without trans fats. Vegetable oil. Low-fat, reduced-fat, or light mayonnaise and salad dressings (reduced-sodium). Canola, safflower, olive, soybean, and sunflower oils. Avocado. Seasoning and other foods Herbs. Spices. Seasoning mixes without salt. Unsalted popcorn and pretzels. Fat-free sweets. What foods are not recommended? The items listed may not be a complete list. Talk with your dietitian about what dietary choices are best for you. Grains Baked goods made with fat, such as croissants, muffins, or some breads. Dry pasta or rice meal packs. Vegetables Creamed or fried vegetables. Vegetables in a cheese sauce. Regular canned vegetables (not low-sodium or reduced-sodium). Regular canned tomato sauce and paste (not low-sodium or reduced-sodium). Regular tomato and vegetable juice (not low-sodium or reduced-sodium). Rosita Fire. Olives. Fruits Canned fruit in a light or heavy syrup. Fried fruit. Fruit in cream or butter sauce. Meat and other protein foods Fatty cuts of meat. Ribs. Fried meat. Tomasa Blase. Sausage. Bologna and  other processed lunch meats. Salami. Fatback. Hotdogs. Bratwurst. Salted nuts and seeds. Canned beans with added salt. Canned or smoked fish. Whole eggs or egg yolks. Chicken or [place] with skin. Dairy Whole or 2% milk, cream, and half-and-half. Whole or full-fat cream cheese. Whole-fat or sweetened yogurt. Full-fat cheese. Nondairy creamers. Whipped toppings. Processed cheese and cheese spreads. Fats and oils Butter. Stick margarine. Lard. Shortening. Ghee. Bacon fat. Tropical oils, such as coconut, palm kernel, or palm oil. Seasoning and other foods Salted popcorn and pretzels. Onion salt, garlic salt, seasoned salt, table salt, and sea salt. Worcestershire sauce. Tartar sauce. Barbecue sauce. Teriyaki sauce. Soy sauce, including reduced-sodium. Steak sauce. Canned and packaged gravies. Fish sauce. Oyster sauce. Cocktail sauce. Horseradish that you find on the shelf. Ketchup. Mustard. Meat flavorings and tenderizers. Bouillon cubes. Hot sauce and Tabasco sauce. Premade or packaged marinades. Premade or packaged taco seasonings. Relishes. Regular salad dressings. Where to find more information:  National Heart, Lung, and Blood Institute: PopSteam.is  American Heart Association: www.heart.org Summary  The DASH eating plan is a healthy eating plan that has been shown to reduce high blood pressure (hypertension). It may also reduce your risk for type 2 diabetes, heart disease, and stroke.  With the DASH eating plan, you should limit salt (sodium) intake to 2,300  mg a day. If you have hypertension, you may need to reduce your sodium intake to 1,500 mg a day.  When on the DASH eating plan, aim to eat more fresh fruits and vegetables, whole grains, lean proteins, low-fat dairy, and heart-healthy fats.  Work with your health care provider or diet and nutrition specialist (dietitian) to adjust your eating plan to your individual calorie needs. This information is not intended to replace advice  given to you by your health care provider. Make sure you discuss any questions you have with your health care provider. Document Revised: 06/20/2017 Document Reviewed: 07/01/2016 Elsevier Patient Education  2020 [employer].

## 2020-03-26 ENCOUNTER — Other Ambulatory Visit: Payer: Self-pay | Admitting: Family Medicine

## 2020-03-26 DIAGNOSIS — E785 Hyperlipidemia, unspecified: Secondary | ICD-10-CM

## 2020-03-28 ENCOUNTER — Telehealth: Payer: Self-pay | Admitting: Physician Assistant

## 2020-03-28 DIAGNOSIS — E785 Hyperlipidemia, unspecified: Secondary | ICD-10-CM

## 2020-03-28 MED ORDER — HYDROCHLOROTHIAZIDE 12.5 MG PO CAPS: ORAL_CAPSULE | ORAL | 1 refills | Status: DC

## 2020-03-28 MED ORDER — SIMVASTATIN 20 MG PO TABS: ORAL_TABLET | ORAL | 1 refills | Status: DC

## 2020-03-28 NOTE — Telephone Encounter (Signed)
Pt called in asking for new scripts of hte hydrochlorothiazide, simvastatin, and aspirin to be sent to the [facility] on [address]. Pt can be reached at the home #

## 2020-03-28 NOTE — Telephone Encounter (Signed)
Please advise on the aspirin? I filled the other 2 meds.

## 2020-03-29 MED ORDER — ASPIRIN EC 81 MG PO TBEC: 81.0000 mg | DELAYED_RELEASE_TABLET | Freq: Every day | ORAL | 11 refills | Status: AC

## 2020-03-29 NOTE — Telephone Encounter (Signed)
Refill for 81 mg ASA sent.

## 2020-09-16 ENCOUNTER — Other Ambulatory Visit: Payer: Self-pay | Admitting: Physician Assistant

## 2020-09-16 DIAGNOSIS — E785 Hyperlipidemia, unspecified: Secondary | ICD-10-CM

## 2020-10-12 ENCOUNTER — Other Ambulatory Visit: Payer: Self-pay | Admitting: Family

## 2020-10-12 DIAGNOSIS — E785 Hyperlipidemia, unspecified: Secondary | ICD-10-CM

## 2020-10-18 ENCOUNTER — Other Ambulatory Visit: Payer: Self-pay | Admitting: Family

## 2020-10-18 DIAGNOSIS — E785 Hyperlipidemia, unspecified: Secondary | ICD-10-CM

## 2020-11-11 ENCOUNTER — Other Ambulatory Visit: Payer: Self-pay | Admitting: Family

## 2020-11-11 DIAGNOSIS — E785 Hyperlipidemia, unspecified: Secondary | ICD-10-CM

## 2020-11-21 ENCOUNTER — Other Ambulatory Visit: Payer: Self-pay | Admitting: Family

## 2020-11-21 DIAGNOSIS — E785 Hyperlipidemia, unspecified: Secondary | ICD-10-CM

## 2020-11-27 ENCOUNTER — Telehealth: Payer: Self-pay | Admitting: Physician Assistant

## 2020-11-27 ENCOUNTER — Other Ambulatory Visit: Payer: Self-pay

## 2020-11-27 DIAGNOSIS — E785 Hyperlipidemia, unspecified: Secondary | ICD-10-CM

## 2020-11-27 DIAGNOSIS — N401 Enlarged prostate with lower urinary tract symptoms: Secondary | ICD-10-CM

## 2020-11-27 MED ORDER — SIMVASTATIN 20 MG PO TABS: ORAL_TABLET | ORAL | 0 refills | Status: DC

## 2020-11-27 MED ORDER — HYDROCHLOROTHIAZIDE 12.5 MG PO CAPS: ORAL_CAPSULE | ORAL | 0 refills | Status: DC

## 2020-11-27 MED ORDER — TAMSULOSIN HCL 0.4 MG PO CAPS: 0.4000 mg | ORAL_CAPSULE | Freq: Every day | ORAL | 0 refills | Status: DC

## 2020-11-27 NOTE — Telephone Encounter (Signed)
Rx has been filled and sent to patient pharmacy. 

## 2020-11-27 NOTE — Telephone Encounter (Signed)
Pt called in asking for new scripts of the Hydrochlorothiazide, simvastatin, and flomax to be sent to the [facility] at [address] [place], [place] [address]  Phone # [phone], pt needs this done today because he will be headed to the next job tomorrow.   Please advise

## 2021-02-18 ENCOUNTER — Other Ambulatory Visit: Payer: Self-pay | Admitting: Family

## 2021-02-18 DIAGNOSIS — E785 Hyperlipidemia, unspecified: Secondary | ICD-10-CM

## 2021-02-21 ENCOUNTER — Ambulatory Visit (INDEPENDENT_AMBULATORY_CARE_PROVIDER_SITE_OTHER): Payer: No Typology Code available for payment source | Admitting: Family Medicine

## 2021-02-21 ENCOUNTER — Encounter: Payer: Self-pay | Admitting: Family Medicine

## 2021-02-21 ENCOUNTER — Other Ambulatory Visit: Payer: Self-pay

## 2021-02-21 VITALS — BP 124/70 | HR 87 | Temp 98.4°F | Resp 17 | Ht 75.0 in | Wt 259.8 lb

## 2021-02-21 DIAGNOSIS — R600 Localized edema: Secondary | ICD-10-CM

## 2021-02-21 DIAGNOSIS — Z87898 Personal history of other specified conditions: Secondary | ICD-10-CM

## 2021-02-21 DIAGNOSIS — R351 Nocturia: Secondary | ICD-10-CM

## 2021-02-21 DIAGNOSIS — N401 Enlarged prostate with lower urinary tract symptoms: Secondary | ICD-10-CM

## 2021-02-21 DIAGNOSIS — E785 Hyperlipidemia, unspecified: Secondary | ICD-10-CM

## 2021-02-21 MED ORDER — TAMSULOSIN HCL 0.4 MG PO CAPS: 0.4000 mg | ORAL_CAPSULE | Freq: Every day | ORAL | 1 refills | Status: AC

## 2021-02-21 MED ORDER — HYDROCHLOROTHIAZIDE 12.5 MG PO CAPS: ORAL_CAPSULE | ORAL | 1 refills | Status: DC

## 2021-02-21 MED ORDER — SIMVASTATIN 20 MG PO TABS: ORAL_TABLET | ORAL | 1 refills | Status: DC

## 2021-02-21 NOTE — Progress Notes (Signed)
Subjective:  Patient ID: Bobby Hale, male    DOB: 10-Jan-1956  Age: 65 y.o. MRN: [id number]  CC:  Chief Complaint  Patient presents with   Establish Care    Pt here to establish care, pt notes he is due for a physical, no concerns   Medication Refill    Pt is in needs a refill on hydrochlorothiazide for his fluid retention surrounding his ankles no as well as simvastatin for cholesterol and flomax for nocturia pt reports no changes regarding these conditions medications working okay     HPI Bobby Hale presents for  New patient to establish care, transfer from [employer], [place].  Pedal edema: Treated with hydrochlorothiazide 12.5 mg daily.  Also has been helpful for treatment of pedal edema. Taking daily. Working well.  Traveling for work. 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  Rare peripheral blurry vision with bright lights, quickly resolves, has been evaluated by eye specialists multiple times, no concerns. No visit in past few years. Same Rx lenses for awhile now.     BP Readings from Last 3 Encounters:  02/21/21 124/70  03/08/20 130/86  04/12/19 128/80   Lab Results  Component Value Date   CREATININE 0.96 04/12/2019   BPH with nocturia Treated with Flomax 0.4 mg daily.  History of elevated PSA.   He was referred in August of last year to urology after previous referrals.  Followed by Dr. Logan Bores with [facility].  Plan for ultrasound on prostate, possible MRI? During Covid. Cancelled, then never rescheduled.   Lab Results  Component Value Date   PSA 8.05 (H) 04/12/2019   PSA 8.74 (H) 02/03/2018   PSA 4.87 (H) 09/02/2016    Hyperlipidemia: With obesity.  Simvastatin 20 mg daily. No new myalgias/side effects.   Wt Readings from Last 3 Encounters:  02/21/21 259 lb 12.8 oz (117.8 kg)  03/08/20 267 lb (121.1 kg)  04/12/19 262 lb 9.6 oz (119.1 kg)   Body mass index is 32.47 kg/m.  Lab Results  Component Value Date   CHOL  189 04/12/2019   HDL 45.40 04/12/2019   LDLCALC 106 (H) 04/12/2019   TRIG 184.0 (H) 04/12/2019   CHOLHDL 4 04/12/2019   Lab Results  Component Value Date   ALT 20 04/12/2019   AST 14 04/12/2019   ALKPHOS 72 04/12/2019   BILITOT 0.6 04/12/2019     History Patient Active Problem List   Diagnosis Date Noted   Mild peripheral edema 03/08/2020   Nuclear sclerotic cataract of both eyes 08/25/2018   Regular astigmatism of both eyes 08/25/2018   Vitreous floater, bilateral 08/25/2018   Former smoker, stopped smoking many years ago 09/02/2016   Class 1 obesity due to excess calories without serious comorbidity with body mass index (BMI) of 31.0 to 31.9 in adult 09/02/2016   Hyperlipidemia 09/02/2016   Benign prostatic hyperplasia with nocturia 09/02/2016   Past Medical History:  Diagnosis Date   Cigar smoker    Former heavy cigarette smoker (20-39 per day)    Quit 1996   HLD (hyperlipidemia)    Obesity    Past Surgical History:  Procedure Laterality Date   ANGIOPLASTY  2000   ANTERIOR CRUCIATE LIGAMENT REPAIR Right 1989   APPENDECTOMY     KNEE ARTHROSCOPY Right 1980   total of 9 surgeries    Allergies  Allergen Reactions   Penicillins    Prior to Admission medications   Medication Sig Start Date End Date Taking? Authorizing Provider  aspirin EC 81 MG tablet Take 1 tablet (81 mg total) by mouth daily. Swallow whole. 03/29/20  Yes Waldon Merl, PA-C  famotidine (PEPCID) 10 MG tablet Take 10 mg by mouth 2 (two) times daily. PRN   Yes [provider]  hydrochlorothiazide (MICROZIDE) 12.5 MG capsule TAKE 1 CAPSULE BY MOUTH EVERY DAY. PLEASE SCHEDULE APPOINTMENT WITH NEW PROVIDER FOR REFILLS 11/27/20  Yes Worthy Rancher B, FNP  loratadine (CLARITIN) 10 MG tablet Take 10 mg by mouth daily.   Yes [provider]  Melatonin 10 MG TABS Take 1 tablet by mouth. PRN when away from home   Yes [provider]  simvastatin (ZOCOR) 20 MG tablet TAKE 1 TABLET BY  MOUTH EVERY DAY IN THE EVENING. PLEASE SCHEDULE REFILLS WITH NEW PROVIDER 11/27/20  Yes Worthy Rancher B, FNP  tamsulosin (FLOMAX) 0.4 MG CAPS capsule Take 1 capsule (0.4 mg total) by mouth daily. 11/27/20  Yes Eulis Foster, FNP   Social History   Socioeconomic History   Marital status: Married    Spouse name: Not on file   Number of children: 3   Years of education: Not on file   Highest education level: Not on file  Occupational History   Occupation: Sales  Tobacco Use   Smoking status: Light Smoker    Types: Cigars   Smokeless tobacco: Never  Vaping Use   Vaping Use: Never used  Substance and Sexual Activity   Alcohol use: Yes   Drug use: No   Sexual activity: Yes  Other Topics Concern   Not on file  Social History Narrative   Current Social History         Who lives at home: Wife, one son 09/02/2016    Transportation: Owns 09/02/2016   Important Relationships & Pets: Daughter, recently married. One son in college (adopted). One son at home (adopted).  09/02/2016    Work / Education:  Some college 09/02/2016   Interests / Fun: Golf 09/02/2016   Social Determinants of Health   Financial Resource Strain: Not on file  Food Insecurity: Not on file  Transportation Needs: Not on file  Physical Activity: Not on file  Stress: Not on file  Social Connections: Not on file  Intimate Partner Violence: Not on file    Review of Systems  Constitutional:  Negative for fatigue and unexpected weight change.  Eyes:  Negative for visual disturbance.  Respiratory:  Negative for cough, chest tightness and shortness of breath.   Cardiovascular:  Negative for chest pain, palpitations and leg swelling.  Gastrointestinal:  Negative for abdominal pain and blood in stool.  Neurological:  Negative for dizziness, light-headedness and headaches.    Objective:   Vitals:   02/21/21 1503  BP: 124/70  Pulse: 87  Resp: 17  Temp: 98.4 F (36.9 C)  TempSrc: Temporal  SpO2: 93%  Weight: 259 lb  12.8 oz (117.8 kg)  Height: 6\' 3"  (1.905 m)     Physical Exam Vitals reviewed.  Constitutional:      Appearance: He is well-developed.  HENT:     Head: Normocephalic and atraumatic.  Eyes:     Extraocular Movements: Extraocular movements intact.     Conjunctiva/sclera: Conjunctivae normal.     Pupils: Pupils are equal, round, and reactive to light.  Neck:     Vascular: No carotid bruit or JVD.  Cardiovascular:     Rate and Rhythm: Normal rate and regular rhythm.     Heart sounds: Normal heart sounds.  No murmur heard. Pulmonary:     Effort: Pulmonary effort is normal.     Breath sounds: Normal breath sounds. No rales.  Musculoskeletal:     Right lower leg: No edema.     Left lower leg: No edema.  Skin:    General: Skin is warm and dry.  Neurological:     Mental Status: He is alert and oriented to person, place, and time.  Psychiatric:        Mood and Affect: Mood normal.       Assessment & Plan:  Bobby Hale is a 66 y.o. male . Mild peripheral edema  -Continue hydrochlorothiazide, suspect there was a component of hypertension along with pedal edema as mild elevation in BP previously.  Continue same dose, check labs.  Hyperlipidemia, unspecified hyperlipidemia type - Plan: hydrochlorothiazide (MICROZIDE) 12.5 MG capsule, simvastatin (ZOCOR) 20 MG tablet, Comprehensive metabolic panel, Lipid panel  -Tolerating Zocor, continue same, check labs.  Benign prostatic hyperplasia with nocturia - Plan: tamsulosin (FLOMAX) 0.4 MG CAPS capsule History of elevated PSA  -Improved control of symptoms on Flomax without any side effects, but with previous elevated PSA stressed importance of follow-up with his urologist, he agrees to call.  Recheck next 3 to 6 months for physical.  Meds ordered this encounter  Medications   hydrochlorothiazide (MICROZIDE) 12.5 MG capsule    Sig: TAKE 1 CAPSULE BY MOUTH EVERY DAY.    Dispense:  90 capsule    Refill:  1    DX Code Needed  .    simvastatin (ZOCOR) 20 MG tablet    Sig: TAKE 1 TABLET BY MOUTH EVERY DAY IN THE EVENING.    Dispense:  90 tablet    Refill:  1   tamsulosin (FLOMAX) 0.4 MG CAPS capsule    Sig: Take 1 capsule (0.4 mg total) by mouth daily.    Dispense:  90 capsule    Refill:  1   Patient Instructions  Please follow up with Dr. Logan Bores for your prostate and testing recommendations. I will refill flomax for now as well as other meds.  I do recommend updated eye exam with eye specialist.  Follow up in next few months for physical but let me know if questions sooner. Thanks for coming in today.     Signed,   Meredith Staggers, MD [facility], [facility] 02/21/21 4:05 PM

## 2021-02-21 NOTE — Patient Instructions (Addendum)
Please follow up with Dr. Logan Bores for your prostate and testing recommendations. I will refill flomax for now as well as other meds.  I do recommend updated eye exam with eye specialist.  Follow up in next few months for physical but let me know if questions sooner. Thanks for coming in today.

## 2021-02-22 LAB — LDL CHOLESTEROL, DIRECT: Direct LDL: 105 mg/dL

## 2021-02-22 LAB — LIPID PANEL
Cholesterol: 181 mg/dL (ref 0–200)
HDL: 44.3 mg/dL (ref >39.00)
NonHDL: 136.92
Total CHOL/HDL Ratio: 4
Triglycerides: 283 mg/dL — ABNORMAL HIGH (ref 0.0–149.0)
VLDL: 56.6 mg/dL — ABNORMAL HIGH (ref 0.0–40.0)

## 2021-02-22 LAB — COMPREHENSIVE METABOLIC PANEL
ALT: 23 U/L (ref 0–53)
AST: 17 U/L (ref 0–37)
Albumin: 4.5 g/dL (ref 3.5–5.2)
Alkaline Phosphatase: 69 U/L (ref 39–117)
BUN: 26 mg/dL — ABNORMAL HIGH (ref 6–23)
CO2: 25 mEq/L (ref 19–32)
Calcium: 10.4 mg/dL (ref 8.4–10.5)
Chloride: 104 mEq/L (ref 96–112)
Creatinine, Ser: 1.33 mg/dL (ref 0.40–1.50)
GFR: 56.35 mL/min — ABNORMAL LOW (ref >60.00)
Glucose, Bld: 80 mg/dL (ref 70–99)
Potassium: 4.1 mEq/L (ref 3.5–5.1)
Sodium: 141 mEq/L (ref 135–145)
Total Bilirubin: 0.5 mg/dL (ref 0.2–1.2)
Total Protein: 7.3 g/dL (ref 6.0–8.3)

## 2021-05-30 ENCOUNTER — Ambulatory Visit (INDEPENDENT_AMBULATORY_CARE_PROVIDER_SITE_OTHER): Payer: No Typology Code available for payment source | Admitting: Family Medicine

## 2021-05-30 ENCOUNTER — Encounter: Payer: Self-pay | Admitting: Family Medicine

## 2021-05-30 VITALS — BP 126/78 | HR 65 | Temp 98.1°F | Resp 16 | Ht 75.0 in | Wt 264.2 lb

## 2021-05-30 DIAGNOSIS — E785 Hyperlipidemia, unspecified: Secondary | ICD-10-CM

## 2021-05-30 DIAGNOSIS — E782 Mixed hyperlipidemia: Secondary | ICD-10-CM | POA: Diagnosis not present

## 2021-05-30 DIAGNOSIS — Z131 Encounter for screening for diabetes mellitus: Secondary | ICD-10-CM | POA: Diagnosis not present

## 2021-05-30 DIAGNOSIS — Z0000 Encounter for general adult medical examination without abnormal findings: Secondary | ICD-10-CM

## 2021-05-30 DIAGNOSIS — R7989 Other specified abnormal findings of blood chemistry: Secondary | ICD-10-CM | POA: Diagnosis not present

## 2021-05-30 DIAGNOSIS — M25561 Pain in right knee: Secondary | ICD-10-CM | POA: Diagnosis not present

## 2021-05-30 DIAGNOSIS — E669 Obesity, unspecified: Secondary | ICD-10-CM

## 2021-05-30 DIAGNOSIS — Z130 Encounter for screening for diseases of the blood and blood-forming organs and certain disorders involving the immune mechanism: Secondary | ICD-10-CM

## 2021-05-30 DIAGNOSIS — G8929 Other chronic pain: Secondary | ICD-10-CM

## 2021-05-30 DIAGNOSIS — Z6833 Body mass index (BMI) 33.0-33.9, adult: Secondary | ICD-10-CM

## 2021-05-30 NOTE — Patient Instructions (Addendum)
I will recheck kidney test with labs. Try to avoid nsaids like advil as much as possible and drink sufficient fluids, water.  Watch diet, try to avoid take out or at least pick healthier options - grilled, baked, etc.  Walking or other low intensity exercise with goal of 150 minutes per week. Pool based exercise may be easier on your knee. Let me know if you need a referral for your knee.  Recheck in 6 months for cholesterol.    Preventive Care 82 Years and Older, Male Preventive care refers to lifestyle choices and visits with your health care provider that can promote health and wellness. Preventive care visits are also called wellness exams. What can I expect for my preventive care visit? Counseling During your preventive care visit, your health care provider may ask about your: Medical history, including: Past medical problems. Family medical history. History of falls. Current health, including: Emotional well-being. Home life and relationship well-being. Sexual activity. Memory and ability to understand (cognition). Lifestyle, including: Alcohol, nicotine or tobacco, and drug use. Access to firearms. Diet, exercise, and sleep habits. Work and work Astronomer. Sunscreen use. Safety issues such as seatbelt and bike helmet use. Physical exam Your health care provider will check your: Height and weight. These may be used to calculate your BMI (body mass index). BMI is a measurement that tells if you are at a healthy weight. Waist circumference. This measures the distance around your waistline. This measurement also tells if you are at a healthy weight and may help predict your risk of certain diseases, such as type 2 diabetes and high blood pressure. Heart rate and blood pressure. Body temperature. Skin for abnormal spots. What immunizations do I need? Vaccines are usually given at various ages, according to a schedule. Your health care provider will recommend vaccines for you  based on your age, medical history, and lifestyle or other factors, such as travel or where you work. What tests do I need? Screening Your health care provider may recommend screening tests for certain conditions. This may include: Lipid and cholesterol levels. Diabetes screening. This is done by checking your blood sugar (glucose) after you have not eaten for a while (fasting). Hepatitis C test. Hepatitis B test. HIV (human immunodeficiency virus) test. STI (sexually transmitted infection) testing, if you are at risk. Lung cancer screening. Colorectal cancer screening. Prostate cancer screening. Abdominal aortic aneurysm (AAA) screening. You may need this if you are a current or former smoker. Talk with your health care provider about your test results, treatment options, and if necessary, the need for more tests. Follow these instructions at home: Eating and drinking  Eat a diet that includes fresh fruits and vegetables, whole grains, lean protein, and low-fat dairy products. Limit your intake of foods with high amounts of sugar, saturated fats, and salt. Take vitamin and mineral supplements as recommended by your health care provider. Do not drink alcohol if your health care provider tells you not to drink. If you drink alcohol: Limit how much you have to 0-2 drinks a day. Know how much alcohol is in your drink. In the [place] one drink equals one 12 oz bottle of beer (355 mL), one 5 oz glass of wine (148 mL), or one 1 oz glass of hard liquor (44 mL). Lifestyle Brush your teeth every morning and night with fluoride toothpaste. Floss one time each day. Exercise for at least 30 minutes 5 or more days each week. Do not use any products that contain nicotine or  tobacco. These products include cigarettes, chewing tobacco, and vaping devices, such as e-cigarettes. If you need help quitting, ask your health care provider. Do not use drugs. If you are sexually active, practice safe sex. Use a  condom or other form of protection to prevent STIs. Take aspirin only as told by your health care provider. Make sure that you understand how much to take and what form to take. Work with your health care provider to find out whether it is safe and beneficial for you to take aspirin daily. Ask your health care provider if you need to take a cholesterol-lowering medicine (statin). Find healthy ways to manage stress, such as: Meditation, yoga, or listening to music. Journaling. Talking to a trusted person. Spending time with friends and family. Safety Always wear your seat belt while driving or riding in a vehicle. Do not drive: If you have been drinking alcohol. Do not ride with someone who has been drinking. When you are tired or distracted. While texting. If you have been using any mind-altering substances or drugs. Wear a helmet and other protective equipment during sports activities. If you have firearms in your house, make sure you follow all gun safety procedures. Minimize exposure to UV radiation to reduce your risk of skin cancer. What's next? Visit your health care provider once a year for an annual wellness visit. Ask your health care provider how often you should have your eyes and teeth checked. Stay up to date on all vaccines. This information is not intended to replace advice given to you by your health care provider. Make sure you discuss any questions you have with your health care provider. Document Revised: 01/03/2021 Document Reviewed: 01/03/2021 Elsevier Patient Education  2022 Else[employer] Inc.   High Triglycerides Eating Plan Triglycerides are a type of fat in the blood. High levels of triglycerides can increase your risk of heart disease and stroke. If your triglyceride levels are high, choosing the right foods can help lower your triglycerides and keep your heart healthy. Work with your health care provider or a dietitian to develop an eating plan that is right for  you. What are tips for following this plan? General guidelines  Lose weight, if you are overweight. For most people, losing 5-10 lb (2-5 kg) helps lower triglyceride levels. A weight-loss plan may include: 30 minutes of exercise at least 5 days a week. Reducing the amount of calories, sugar, and fat you eat. Eat a wide variety of fresh fruits, vegetables, and whole grains. These foods are high in fiber. Eat foods that contain healthy fats, such as fatty fish, nuts, seeds, and olive oil. Avoid foods that are high in added sugar, added salt (sodium), and saturated fat. Avoid low-fiber, refined carbohydrates such as white bread, crackers, noodles, and white rice. Avoid foods with trans fats or partially hydrogenated oils, such as fried foods or stick margarine. If you drink alcohol: Limit how much you have to: 0-1 drink a day for women who are not pregnant. 0-2 drinks a day for men. Your health care provider may recommend that you drink less than these amounts depending on your overall health. Know how much alcohol is in a drink. In the [place] one drink equals one 12 oz bottle of beer (355 mL), one 5 oz glass of wine (148 mL), or one 1 oz glass of hard liquor (44 mL). Reading food labels Check food labels for: The amount of saturated fat. Choose foods with no or very little saturated fat (less than  2 g). The amount of trans fat. Choose foods with no transfat. The amount of cholesterol. Choose foods that are low in cholesterol. The amount of sodium. Choose foods with less than 140 milligrams (mg) per serving. Shopping Buy dairy products labeled as nonfat (skim) or low-fat (1%). Avoid buying processed or prepackaged foods. These are often high in added sugar, sodium, and fat. Cooking Choose healthy fats when cooking, such as olive oil, avocado oil, or canola oil. Cook foods using lower fat methods, such as baking, broiling, boiling, or grilling. Make your own sauces, dressings, and marinades  when possible, instead of buying them. Store-bought sauces, dressings, and marinades are often high in sodium and sugar. Meal planning Eat more home-cooked food and less restaurant, buffet, and fast food. Eat fatty fish at least 2 times each week. Examples of fatty fish include salmon, trout, sardines, mackerel, tuna, and herring. If you eat whole eggs, do not eat more than 4 egg yolks per week. What foods should I eat? Fruits All fresh, canned (in natural juice), or frozen fruits. Vegetables Fresh or frozen vegetables. Low-sodium canned vegetables. Grains Whole wheat or whole grain breads, crackers, cereals, and pasta. Unsweetened oatmeal. Bulgur. Barley. Quinoa. Brown rice. Whole wheat flour tortillas. Meats and other proteins Skinless chicken or [place]. Ground chicken or [place]. Lean cuts of pork, trimmed of fat. Fish and seafood, especially salmon, trout, and herring. Egg whites. Dried beans, peas, or lentils. Unsalted nuts or seeds. Unsalted canned beans. Natural peanut or almond butter or other nut butters. Dairy Low-fat dairy products. Skim or low-fat (1%) milk. Reduced fat (2%) and low-sodium cheese. Low-fat ricotta cheese. Low-fat cottage cheese. Plain, low-fat yogurt. Fats and oils Tub margarine without trans fats. Light or reduced-fat mayonnaise. Light or reduced-fat salad dressings. Avocado. Safflower, olive, sunflower, soybean, and canola oils. The items listed above may not be a complete list of recommended foods and beverages. Talk with your dietitian about what dietary choices are best for you. What foods should I avoid? Fruits Sweetened dried fruit. Canned fruit in syrup. Fruit juice. Vegetables Creamed or fried vegetables. Vegetables in a cheese sauce. Grains White bread. White (regular) pasta. White rice. Cornbread. Bagels. Pastries. Crackers that contain trans fat. Meats and other proteins Fatty cuts of meat. Ribs. Chicken wings. Tomasa Blase. Sausage. Bologna. Salami.  Chitterlings. Fatback. Hot dogs. Bratwurst. Packaged lunch meats. Dairy Whole or reduced-fat (2%) milk. Half-and-half. Cream cheese. Full-fat or sweetened yogurt. Full-fat cheese. Nondairy creamers. Whipped toppings. Processed cheese or cheese spreads. Cheese curds. Fats and oils Butter. Stick margarine. Lard. Shortening. Ghee. Bacon fat. Tropical oils, such as coconut, palm kernel, or palm oils. Beverages Alcohol. Sweetened drinks, such as soda, lemonade, fruit drinks, or punches. Sweets and desserts Corn syrup. Sugars. Honey. Molasses. Candy. Jam and jelly. Syrup. Sweetened cereals. Cookies. Pies. Cakes. Donuts. Muffins. Ice cream. Condiments Store-bought sauces, dressings, and marinades that are high in sugar, such as ketchup and barbecue sauce. The items listed above may not be a complete list of foods and beverages you should avoid. Talk with your dietitian about what dietary choices are best for you. Summary High levels of triglycerides can increase the risk of heart disease and stroke. Choosing the right foods can help lower your triglycerides. Eat plenty of fresh fruits, vegetables, and whole grains. Choose low-fat dairy and lean meats. Eat fatty fish at least twice a week. Avoid processed and prepackaged foods with added sugar, sodium, saturated fat, and trans fat. If you need suggestions or have questions about what types  of food are good for you, talk with your health care provider or a dietitian. This information is not intended to replace advice given to you by your health care provider. Make sure you discuss any questions you have with your health care provider. Document Revised: 11/17/2020 Document Reviewed: 11/17/2020 Elsevier Patient Education  2022 [employer].

## 2021-05-30 NOTE — Progress Notes (Signed)
Subjective:  Patient ID: Bobby Hale, male    DOB: Nov 30, 1955  Age: 65 y.o. MRN: [id number]  CC:  Chief Complaint  Patient presents with   Medicare Wellness    Pt is new to medicare, notes he is not fasting today    HPI Bobby Hale presents for   Presents for annual wellness exam/physical.  New patient to me to establish care August 3   Care team: PCP: me Urologist, Dr. Logan Bores, BPH with nocturia.  Suspect a component of hypertension with peripheral edema and treated with hydrochlorothiazide.  Continue same dose in August.  Also tolerating Zocor at that time for hyperlipidemia.  Triglycerides were elevated at 283, and creatinine was slightly higher than previous few years (0.96-1.33) plan for recheck today.  Diet/exercise changes were discussed for hypertriglyceridemia. About the same as last visit. Still on simvastatin 20mg  qd. Not fasting.   Take out food/restaurants for food when traveling.  Body mass index is 33.02 kg/m.  Viral illness last week, getting better. No fever, grandchild had RSV. Did not have covid testing. No fever.   Lab Results  Component Value Date   CREATININE 1.33 02/21/2021   Lab Results  Component Value Date   CHOL 181 02/21/2021   HDL 44.30 02/21/2021   LDLCALC 106 (H) 04/12/2019   LDLDIRECT 105.0 02/21/2021   TRIG 283.0 (H) 02/21/2021   CHOLHDL 4 02/21/2021    Fall screening Fall Risk  05/30/2021 02/21/2021 02/07/2018  Falls in the past year? 0 0 No  Number falls in past yr: 0 0 -  Injury with Fall? 0 0 -  Risk for fall due to : No Fall Risks No Fall Risks -  Follow up Falls evaluation completed Falls evaluation completed -    Depression Screening: Depression screen [facility] 2/9 05/30/2021 02/21/2021 04/12/2019 02/07/2018  Decreased Interest 0 0 0 0  Down, Depressed, Hopeless 0 0 0 0  PHQ - 2 Score 0 0 0 0  Altered sleeping - - 1 -  Tired, decreased energy - - 0 -  Change in appetite - - 0 -  Feeling bad or failure about yourself  - - 0  -  Trouble concentrating - - 0 -  Moving slowly or fidgety/restless - - 0 -  Suicidal thoughts - - 0 -  PHQ-9 Score - - 1 -  Difficult doing work/chores - - Not difficult at all -   Cancer Screening: Colonoscopy 08/22/2014 Followed by urology for prostate - see last visit - plans to schedule visit soon. PSA 1.55 in 11/21.   Immunization History  Administered Date(s) Administered   Influenza,inj,Quad PF,6+ Mos 04/12/2019   Janssen (J&J) SARS-COV-2 Vaccination 01/01/2020   Tdap 07/17/2016, 04/12/2019  Covid booster - declined.  Flu vaccine: declined Shingrix - declined at this time.   Alcohol Screening: no alcohol in 27 years.   Tobacco: Occasional cigar.   Vision Screening   Right eye Left eye Both eyes  Without correction     With correction 20/20-1 20/30 20/20-1  Optho/optometry- wears glasses as needed. Optho in [facility] every 2 years.   Dental: Has dentist in [place], every 3 months and periodontist locally.   Exercise: No regular exercise. Some golf. R knee pain for years. Has been told to have knee replacement for years by previous orthopaedist in [place] - would like to see provider locally - he will call and let me know.  Wears brace to play golf.  No pain meds usually - occasionally advil, tylenol.  Advanced Directives:  No HCPOA, living will  - paperwork given.   History Patient Active Problem List   Diagnosis Date Noted   Mild peripheral edema 03/08/2020   Nuclear sclerotic cataract of both eyes 08/25/2018   Regular astigmatism of both eyes 08/25/2018   Vitreous floater, bilateral 08/25/2018   Former smoker, stopped smoking many years ago 09/02/2016   Class 1 obesity due to excess calories without serious comorbidity with body mass index (BMI) of 31.0 to 31.9 in adult 09/02/2016   Hyperlipidemia 09/02/2016   Benign prostatic hyperplasia with nocturia 09/02/2016   Past Medical History:  Diagnosis Date   Cigar smoker    Former heavy cigarette  smoker (20-39 per day)    Quit 1996   HLD (hyperlipidemia)    Obesity    Past Surgical History:  Procedure Laterality Date   ANGIOPLASTY  2000   ANTERIOR CRUCIATE LIGAMENT REPAIR Right 1989   APPENDECTOMY     KNEE ARTHROSCOPY Right 1980   total of 9 surgeries    Allergies  Allergen Reactions   Penicillins    Prior to Admission medications   Medication Sig Start Date End Date Taking? Authorizing Provider  aspirin EC 81 MG tablet Take 1 tablet (81 mg total) by mouth daily. Swallow whole. 03/29/20  Yes Waldon Merl, PA-C  famotidine (PEPCID) 10 MG tablet Take 10 mg by mouth 2 (two) times daily. PRN   Yes [provider]  hydrochlorothiazide (MICROZIDE) 12.5 MG capsule TAKE 1 CAPSULE BY MOUTH EVERY DAY. 02/21/21  Yes Shade Flood, MD  loratadine (CLARITIN) 10 MG tablet Take 10 mg by mouth daily.   Yes [provider]  Melatonin 10 MG TABS Take 1 tablet by mouth. PRN when away from home   Yes [provider]  simvastatin (ZOCOR) 20 MG tablet TAKE 1 TABLET BY MOUTH EVERY DAY IN THE EVENING. 02/21/21  Yes Shade Flood, MD  tamsulosin (FLOMAX) 0.4 MG CAPS capsule Take 1 capsule (0.4 mg total) by mouth daily. 02/21/21  Yes Shade Flood, MD   Social History   Socioeconomic History   Marital status: Married    Spouse name: Not on file   Number of children: 3   Years of education: Not on file   Highest education level: Not on file  Occupational History   Occupation: Sales  Tobacco Use   Smoking status: Light Smoker    Types: Cigars   Smokeless tobacco: Never  Vaping Use   Vaping Use: Never used  Substance and Sexual Activity   Alcohol use: Yes    Comment: rare   Drug use: No   Sexual activity: Yes  Other Topics Concern   Not on file  Social History Narrative   Current Social History         Who lives at home: Wife, one son 09/02/2016    Transportation: Owns 09/02/2016   Important Relationships & Pets: Daughter, recently married. One son  in college (adopted). One son at home (adopted).  09/02/2016    Work / Education:  Some college 09/02/2016   Interests / Fun: Golf 09/02/2016   Social Determinants of Health   Financial Resource Strain: Not on file  Food Insecurity: Not on file  Transportation Needs: Not on file  Physical Activity: Not on file  Stress: Not on file  Social Connections: Not on file  Intimate Partner Violence: Not on file    Review of Systems 13 point review of systems per patient health survey  noted.  Negative other than as indicated above or in HPI.    Objective:   Vitals:   05/30/21 1358  BP: 126/78  Pulse: 65  Resp: 16  Temp: 98.1 F (36.7 C)  TempSrc: Temporal  SpO2: 97%  Weight: 264 lb 3.2 oz (119.8 kg)  Height: 6\' 3"  (1.905 m)     Physical Exam Vitals reviewed.  Constitutional:      Appearance: He is well-developed.  HENT:     Head: Normocephalic and atraumatic.  Neck:     Vascular: No carotid bruit or JVD.  Cardiovascular:     Rate and Rhythm: Normal rate and regular rhythm.     Heart sounds: Normal heart sounds. No murmur heard. Pulmonary:     Effort: Pulmonary effort is normal.     Breath sounds: Normal breath sounds. No rales.  Musculoskeletal:     Right lower leg: No edema.     Left lower leg: No edema.  Skin:    General: Skin is warm and dry.  Neurological:     Mental Status: He is alert and oriented to person, place, and time.  Psychiatric:        Mood and Affect: Mood normal.       Assessment & Plan:  Mamadou Breon is a 65 y.o. male . Annual physical exam - Plan: Hemoglobin A1c  - -anticipatory guidance as below in AVS, screening labs above. Health maintenance items as above in HPI discussed/recommended as applicable.   Mixed hyperlipidemia  -Continue Zocor, healthier food options discussed, activity/exercise as tolerated by his knee, pool-based exercise discussed.  Recheck levels in 6 months.  Screening for diabetes mellitus - Plan: Hemoglobin A1c,  Basic metabolic panel  Increase in creatinine - Plan: Basic metabolic panel Avoidance of NSAIDs, maintenance of hydration.  Recheck levels.  Chronic pain of right knee Follow-up planned with Ortho, referral if needed.  Pool-based exercise may be easier on his knee pain.  Class 1 obesity without serious comorbidity with body mass index (BMI) of 33.0 to 33.9 in adult, unspecified obesity type  -Diet/exercise discussed including pool-based exercise as above.  Recheck 6 months.  No orders of the defined types were placed in this encounter.  Patient Instructions  I will recheck kidney test with labs. Try to avoid nsaids like advil as much as possible and drink sufficient fluids, water.  Watch diet, try to avoid take out or at least pick healthier options - grilled, baked, etc.  Walking or other low intensity exercise with goal of 150 minutes per week. Pool based exercise may be easier on your knee. Let me know if you need a referral for your knee.  Recheck in 6 months for cholesterol.    Preventive Care 107 Years and Older, Male Preventive care refers to lifestyle choices and visits with your health care provider that can promote health and wellness. Preventive care visits are also called wellness exams. What can I expect for my preventive care visit? Counseling During your preventive care visit, your health care provider may ask about your: Medical history, including: Past medical problems. Family medical history. History of falls. Current health, including: Emotional well-being. Home life and relationship well-being. Sexual activity. Memory and ability to understand (cognition). Lifestyle, including: Alcohol, nicotine or tobacco, and drug use. Access to firearms. Diet, exercise, and sleep habits. Work and work 76. Sunscreen use. Safety issues such as seatbelt and bike helmet use. Physical exam Your health care provider will check your: Height and weight. These may  be  used to calculate your BMI (body mass index). BMI is a measurement that tells if you are at a healthy weight. Waist circumference. This measures the distance around your waistline. This measurement also tells if you are at a healthy weight and may help predict your risk of certain diseases, such as type 2 diabetes and high blood pressure. Heart rate and blood pressure. Body temperature. Skin for abnormal spots. What immunizations do I need? Vaccines are usually given at various ages, according to a schedule. Your health care provider will recommend vaccines for you based on your age, medical history, and lifestyle or other factors, such as travel or where you work. What tests do I need? Screening Your health care provider may recommend screening tests for certain conditions. This may include: Lipid and cholesterol levels. Diabetes screening. This is done by checking your blood sugar (glucose) after you have not eaten for a while (fasting). Hepatitis C test. Hepatitis B test. HIV (human immunodeficiency virus) test. STI (sexually transmitted infection) testing, if you are at risk. Lung cancer screening. Colorectal cancer screening. Prostate cancer screening. Abdominal aortic aneurysm (AAA) screening. You may need this if you are a current or former smoker. Talk with your health care provider about your test results, treatment options, and if necessary, the need for more tests. Follow these instructions at home: Eating and drinking  Eat a diet that includes fresh fruits and vegetables, whole grains, lean protein, and low-fat dairy products. Limit your intake of foods with high amounts of sugar, saturated fats, and salt. Take vitamin and mineral supplements as recommended by your health care provider. Do not drink alcohol if your health care provider tells you not to drink. If you drink alcohol: Limit how much you have to 0-2 drinks a day. Know how much alcohol is in your drink. In the  [place] one drink equals one 12 oz bottle of beer (355 mL), one 5 oz glass of wine (148 mL), or one 1 oz glass of hard liquor (44 mL). Lifestyle Brush your teeth every morning and night with fluoride toothpaste. Floss one time each day. Exercise for at least 30 minutes 5 or more days each week. Do not use any products that contain nicotine or tobacco. These products include cigarettes, chewing tobacco, and vaping devices, such as e-cigarettes. If you need help quitting, ask your health care provider. Do not use drugs. If you are sexually active, practice safe sex. Use a condom or other form of protection to prevent STIs. Take aspirin only as told by your health care provider. Make sure that you understand how much to take and what form to take. Work with your health care provider to find out whether it is safe and beneficial for you to take aspirin daily. Ask your health care provider if you need to take a cholesterol-lowering medicine (statin). Find healthy ways to manage stress, such as: Meditation, yoga, or listening to music. Journaling. Talking to a trusted person. Spending time with friends and family. Safety Always wear your seat belt while driving or riding in a vehicle. Do not drive: If you have been drinking alcohol. Do not ride with someone who has been drinking. When you are tired or distracted. While texting. If you have been using any mind-altering substances or drugs. Wear a helmet and other protective equipment during sports activities. If you have firearms in your house, make sure you follow all gun safety procedures. Minimize exposure to UV radiation to reduce your risk of skin  cancer. What's next? Visit your health care provider once a year for an annual wellness visit. Ask your health care provider how often you should have your eyes and teeth checked. Stay up to date on all vaccines. This information is not intended to replace advice given to you by your health care  provider. Make sure you discuss any questions you have with your health care provider. Document Revised: 01/03/2021 Document Reviewed: 01/03/2021 Elsevier Patient Education  2022 Elsevier Inc.   High Triglycerides Eating Plan Triglycerides are a type of fat in the blood. High levels of triglycerides can increase your risk of heart disease and stroke. If your triglyceride levels are high, choosing the right foods can help lower your triglycerides and keep your heart healthy. Work with your health care provider or a dietitian to develop an eating plan that is right for you. What are tips for following this plan? General guidelines  Lose weight, if you are overweight. For most people, losing 5-10 lb (2-5 kg) helps lower triglyceride levels. A weight-loss plan may include: 30 minutes of exercise at least 5 days a week. Reducing the amount of calories, sugar, and fat you eat. Eat a wide variety of fresh fruits, vegetables, and whole grains. These foods are high in fiber. Eat foods that contain healthy fats, such as fatty fish, nuts, seeds, and olive oil. Avoid foods that are high in added sugar, added salt (sodium), and saturated fat. Avoid low-fiber, refined carbohydrates such as white bread, crackers, noodles, and white rice. Avoid foods with trans fats or partially hydrogenated oils, such as fried foods or stick margarine. If you drink alcohol: Limit how much you have to: 0-1 drink a day for women who are not pregnant. 0-2 drinks a day for men. Your health care provider may recommend that you drink less than these amounts depending on your overall health. Know how much alcohol is in a drink. In the [place] one drink equals one 12 oz bottle of beer (355 mL), one 5 oz glass of wine (148 mL), or one 1 oz glass of hard liquor (44 mL). Reading food labels Check food labels for: The amount of saturated fat. Choose foods with no or very little saturated fat (less than 2 g). The amount of trans fat.  Choose foods with no transfat. The amount of cholesterol. Choose foods that are low in cholesterol. The amount of sodium. Choose foods with less than 140 milligrams (mg) per serving. Shopping Buy dairy products labeled as nonfat (skim) or low-fat (1%). Avoid buying processed or prepackaged foods. These are often high in added sugar, sodium, and fat. Cooking Choose healthy fats when cooking, such as olive oil, avocado oil, or canola oil. Cook foods using lower fat methods, such as baking, broiling, boiling, or grilling. Make your own sauces, dressings, and marinades when possible, instead of buying them. Store-bought sauces, dressings, and marinades are often high in sodium and sugar. Meal planning Eat more home-cooked food and less restaurant, buffet, and fast food. Eat fatty fish at least 2 times each week. Examples of fatty fish include salmon, trout, sardines, mackerel, tuna, and herring. If you eat whole eggs, do not eat more than 4 egg yolks per week. What foods should I eat? Fruits All fresh, canned (in natural juice), or frozen fruits. Vegetables Fresh or frozen vegetables. Low-sodium canned vegetables. Grains Whole wheat or whole grain breads, crackers, cereals, and pasta. Unsweetened oatmeal. Bulgur. Barley. Quinoa. Brown rice. Whole wheat flour tortillas. Meats and other proteins Skinless  chicken or [place]. Ground chicken or [place]. Lean cuts of pork, trimmed of fat. Fish and seafood, especially salmon, trout, and herring. Egg whites. Dried beans, peas, or lentils. Unsalted nuts or seeds. Unsalted canned beans. Natural peanut or almond butter or other nut butters. Dairy Low-fat dairy products. Skim or low-fat (1%) milk. Reduced fat (2%) and low-sodium cheese. Low-fat ricotta cheese. Low-fat cottage cheese. Plain, low-fat yogurt. Fats and oils Tub margarine without trans fats. Light or reduced-fat mayonnaise. Light or reduced-fat salad dressings. Avocado. Safflower, olive,  sunflower, soybean, and canola oils. The items listed above may not be a complete list of recommended foods and beverages. Talk with your dietitian about what dietary choices are best for you. What foods should I avoid? Fruits Sweetened dried fruit. Canned fruit in syrup. Fruit juice. Vegetables Creamed or fried vegetables. Vegetables in a cheese sauce. Grains White bread. White (regular) pasta. White rice. Cornbread. Bagels. Pastries. Crackers that contain trans fat. Meats and other proteins Fatty cuts of meat. Ribs. Chicken wings. Tomasa Blase. Sausage. Bologna. Salami. Chitterlings. Fatback. Hot dogs. Bratwurst. Packaged lunch meats. Dairy Whole or reduced-fat (2%) milk. Half-and-half. Cream cheese. Full-fat or sweetened yogurt. Full-fat cheese. Nondairy creamers. Whipped toppings. Processed cheese or cheese spreads. Cheese curds. Fats and oils Butter. Stick margarine. Lard. Shortening. Ghee. Bacon fat. Tropical oils, such as coconut, palm kernel, or palm oils. Beverages Alcohol. Sweetened drinks, such as soda, lemonade, fruit drinks, or punches. Sweets and desserts Corn syrup. Sugars. Honey. Molasses. Candy. Jam and jelly. Syrup. Sweetened cereals. Cookies. Pies. Cakes. Donuts. Muffins. Ice cream. Condiments Store-bought sauces, dressings, and marinades that are high in sugar, such as ketchup and barbecue sauce. The items listed above may not be a complete list of foods and beverages you should avoid. Talk with your dietitian about what dietary choices are best for you. Summary High levels of triglycerides can increase the risk of heart disease and stroke. Choosing the right foods can help lower your triglycerides. Eat plenty of fresh fruits, vegetables, and whole grains. Choose low-fat dairy and lean meats. Eat fatty fish at least twice a week. Avoid processed and prepackaged foods with added sugar, sodium, saturated fat, and trans fat. If you need suggestions or have questions about what  types of food are good for you, talk with your health care provider or a dietitian. This information is not intended to replace advice given to you by your health care provider. Make sure you discuss any questions you have with your health care provider. Document Revised: 11/17/2020 Document Reviewed: 11/17/2020 Elsevier Patient Education  2022 Elsevier Inc.            Signed,   Meredith Staggers, MD [facility], [facility] 05/30/21 2:28 PM

## 2021-05-31 LAB — BASIC METABOLIC PANEL
BUN: 21 mg/dL (ref 6–23)
CO2: 30 mEq/L (ref 19–32)
Calcium: 9.8 mg/dL (ref 8.4–10.5)
Chloride: 100 mEq/L (ref 96–112)
Creatinine, Ser: 1.08 mg/dL (ref 0.40–1.50)
GFR: 72.2 mL/min (ref >60.00)
Glucose, Bld: 81 mg/dL (ref 70–99)
Potassium: 4.2 mEq/L (ref 3.5–5.1)
Sodium: 138 mEq/L (ref 135–145)

## 2021-05-31 LAB — HEMOGLOBIN A1C: Hgb A1c MFr Bld: 5.6 % (ref 4.6–6.5)

## 2021-08-12 ENCOUNTER — Other Ambulatory Visit: Payer: Self-pay | Admitting: Family Medicine

## 2021-08-12 DIAGNOSIS — E785 Hyperlipidemia, unspecified: Secondary | ICD-10-CM

## 2021-08-24 ENCOUNTER — Other Ambulatory Visit: Payer: Self-pay | Admitting: Family Medicine

## 2021-08-24 DIAGNOSIS — E785 Hyperlipidemia, unspecified: Secondary | ICD-10-CM

## 2021-11-26 ENCOUNTER — Other Ambulatory Visit: Payer: Self-pay | Admitting: Family Medicine

## 2021-11-26 DIAGNOSIS — E785 Hyperlipidemia, unspecified: Secondary | ICD-10-CM

## 2021-11-28 ENCOUNTER — Ambulatory Visit: Payer: No Typology Code available for payment source | Admitting: Family Medicine

## 2022-02-25 ENCOUNTER — Other Ambulatory Visit: Payer: Self-pay | Admitting: Family Medicine

## 2022-02-25 DIAGNOSIS — E785 Hyperlipidemia, unspecified: Secondary | ICD-10-CM

## 2022-02-26 ENCOUNTER — Other Ambulatory Visit: Payer: Self-pay | Admitting: Family Medicine

## 2022-02-26 DIAGNOSIS — E785 Hyperlipidemia, unspecified: Secondary | ICD-10-CM

## 2022-08-04 ENCOUNTER — Other Ambulatory Visit: Payer: Self-pay | Admitting: Family Medicine

## 2022-08-04 DIAGNOSIS — E785 Hyperlipidemia, unspecified: Secondary | ICD-10-CM

## 2023-01-28 ENCOUNTER — Other Ambulatory Visit: Payer: Self-pay | Admitting: Family Medicine

## 2023-01-28 DIAGNOSIS — E785 Hyperlipidemia, unspecified: Secondary | ICD-10-CM
